# Patient Record
Sex: Male | Born: 1965 | Race: White | Hispanic: No | Marital: Married | Smoking: Never smoker
Health system: Southern US, Community
[De-identification: ages and names within clinical notes are randomized; demographics above are authoritative.]

## PROBLEM LIST (undated history)

## (undated) DIAGNOSIS — R112 Nausea with vomiting, unspecified: Secondary | ICD-10-CM

## (undated) DIAGNOSIS — M5136 Other intervertebral disc degeneration, lumbar region: Secondary | ICD-10-CM

## (undated) DIAGNOSIS — M1990 Unspecified osteoarthritis, unspecified site: Secondary | ICD-10-CM

## (undated) DIAGNOSIS — R7303 Prediabetes: Secondary | ICD-10-CM

## (undated) DIAGNOSIS — K219 Gastro-esophageal reflux disease without esophagitis: Secondary | ICD-10-CM

## (undated) DIAGNOSIS — F909 Attention-deficit hyperactivity disorder, unspecified type: Secondary | ICD-10-CM

## (undated) DIAGNOSIS — I10 Essential (primary) hypertension: Secondary | ICD-10-CM

## (undated) DIAGNOSIS — G8929 Other chronic pain: Secondary | ICD-10-CM

## (undated) DIAGNOSIS — Z9889 Other specified postprocedural states: Secondary | ICD-10-CM

## (undated) DIAGNOSIS — T8859XA Other complications of anesthesia, initial encounter: Secondary | ICD-10-CM

## (undated) DIAGNOSIS — B001 Herpesviral vesicular dermatitis: Secondary | ICD-10-CM

## (undated) DIAGNOSIS — R972 Elevated prostate specific antigen [PSA]: Secondary | ICD-10-CM

## (undated) HISTORY — PX: HERNIA REPAIR: SHX51

## (undated) HISTORY — PX: VASECTOMY: SHX75

## (undated) HISTORY — PX: CERVICAL SPINE SURGERY: SHX589

## (undated) HISTORY — PX: KNEE ARTHROSCOPY: SUR90

## (undated) HISTORY — PX: COLONOSCOPY: SHX174

---

## 2012-05-09 DIAGNOSIS — B001 Herpesviral vesicular dermatitis: Secondary | ICD-10-CM | POA: Insufficient documentation

## 2012-05-09 DIAGNOSIS — M549 Dorsalgia, unspecified: Secondary | ICD-10-CM | POA: Insufficient documentation

## 2014-07-29 DIAGNOSIS — K4090 Unilateral inguinal hernia, without obstruction or gangrene, not specified as recurrent: Secondary | ICD-10-CM | POA: Insufficient documentation

## 2016-04-25 DIAGNOSIS — R972 Elevated prostate specific antigen [PSA]: Secondary | ICD-10-CM | POA: Insufficient documentation

## 2017-02-11 DIAGNOSIS — M5441 Lumbago with sciatica, right side: Secondary | ICD-10-CM | POA: Insufficient documentation

## 2017-02-11 DIAGNOSIS — G8929 Other chronic pain: Secondary | ICD-10-CM | POA: Insufficient documentation

## 2017-04-08 DIAGNOSIS — R0789 Other chest pain: Secondary | ICD-10-CM | POA: Insufficient documentation

## 2017-04-08 DIAGNOSIS — I209 Angina pectoris, unspecified: Secondary | ICD-10-CM

## 2017-04-08 HISTORY — DX: Angina pectoris, unspecified: I20.9

## 2017-05-17 DIAGNOSIS — I10 Essential (primary) hypertension: Secondary | ICD-10-CM | POA: Insufficient documentation

## 2019-08-28 ENCOUNTER — Ambulatory Visit: Payer: Self-pay | Attending: Internal Medicine

## 2019-08-28 DIAGNOSIS — Z23 Encounter for immunization: Secondary | ICD-10-CM

## 2019-08-28 NOTE — Progress Notes (Signed)
   Covid-19 Vaccination Clinic  Name:  Willie Patton    MRN: [id number] DOB: 03-29-1966  08/28/2019  Willie Patton was observed post Covid-19 immunization for 15 minutes without incident. He was provided with Vaccine Information Sheet and instruction to access the V-Safe system.   Willie Patton was instructed to call 911 with any severe reactions post vaccine: Marland Kitchen Difficulty breathing  . Swelling of face and throat  . A fast heartbeat  . A bad rash all over body  . Dizziness and weakness   Immunizations Administered    Name Date Dose VIS Date Route   Pfizer COVID-19 Vaccine 08/28/2019  1:53 PM 0.3 mL 05/15/2019 Intramuscular   Manufacturer: [employer], [employer]   Lot: [id number]   NDC: [address]-2

## 2019-09-22 ENCOUNTER — Ambulatory Visit: Payer: Self-pay | Attending: Internal Medicine

## 2019-09-22 DIAGNOSIS — Z23 Encounter for immunization: Secondary | ICD-10-CM

## 2019-09-22 NOTE — Progress Notes (Signed)
   Covid-19 Vaccination Clinic  Name:  JONATHA GAGEN    MRN: [id number] DOB: 02/08/1966  09/22/2019  Mr. Tassinari was observed post Covid-19 immunization for 15 minutes without incident. He was provided with Vaccine Information Sheet and instruction to access the V-Safe system.   Mr. Drost was instructed to call 911 with any severe reactions post vaccine: Marland Kitchen Difficulty breathing  . Swelling of face and throat  . A fast heartbeat  . A bad rash all over body  . Dizziness and weakness   Immunizations Administered    Name Date Dose VIS Date Route   Pfizer COVID-19 Vaccine 09/22/2019  9:18 AM 0.3 mL 07/29/2018 Intramuscular   Manufacturer: [employer], [employer]   Lot: [id number]   NDC: [address]-9

## 2020-05-08 DIAGNOSIS — N529 Male erectile dysfunction, unspecified: Secondary | ICD-10-CM | POA: Insufficient documentation

## 2020-11-30 DIAGNOSIS — M5136 Other intervertebral disc degeneration, lumbar region: Secondary | ICD-10-CM | POA: Insufficient documentation

## 2020-12-28 ENCOUNTER — Other Ambulatory Visit: Payer: Self-pay | Admitting: Orthopedic Surgery

## 2020-12-28 DIAGNOSIS — G8929 Other chronic pain: Secondary | ICD-10-CM

## 2020-12-28 DIAGNOSIS — M5136 Other intervertebral disc degeneration, lumbar region: Secondary | ICD-10-CM

## 2020-12-28 DIAGNOSIS — M4807 Spinal stenosis, lumbosacral region: Secondary | ICD-10-CM

## 2020-12-28 DIAGNOSIS — M48061 Spinal stenosis, lumbar region without neurogenic claudication: Secondary | ICD-10-CM

## 2021-01-10 ENCOUNTER — Ambulatory Visit
Admission: RE | Admit: 2021-01-10 | Discharge: 2021-01-10 | Disposition: A | Discharge status: Home / Self Care | Payer: BC Managed Care – PPO | Source: Ambulatory Visit | Attending: Orthopedic Surgery | Admitting: Orthopedic Surgery

## 2021-01-10 ENCOUNTER — Other Ambulatory Visit: Payer: Self-pay

## 2021-01-10 DIAGNOSIS — M5136 Other intervertebral disc degeneration, lumbar region: Secondary | ICD-10-CM | POA: Insufficient documentation

## 2021-01-10 DIAGNOSIS — G8929 Other chronic pain: Secondary | ICD-10-CM | POA: Diagnosis present

## 2021-01-10 DIAGNOSIS — M48061 Spinal stenosis, lumbar region without neurogenic claudication: Secondary | ICD-10-CM | POA: Diagnosis present

## 2021-01-10 DIAGNOSIS — M4807 Spinal stenosis, lumbosacral region: Secondary | ICD-10-CM | POA: Insufficient documentation

## 2021-01-10 DIAGNOSIS — M5442 Lumbago with sciatica, left side: Secondary | ICD-10-CM | POA: Insufficient documentation

## 2021-03-19 DIAGNOSIS — M1712 Unilateral primary osteoarthritis, left knee: Secondary | ICD-10-CM | POA: Insufficient documentation

## 2021-05-23 NOTE — Discharge Instructions (Signed)
Instructions after Total Knee Replacement   Kaenan Jake P. Henry Demeritt, Jr., M.D.     Dept. of Orthopaedics & Sports Medicine  [facility]  [address]  [place], [place]  [address]  Phone: [phone]   Fax: [phone]    DIET: Drink plenty of non-alcoholic fluids. Resume your normal diet. Include foods high in fiber.  ACTIVITY:  You may use crutches or a walker with weight-bearing as tolerated, unless instructed otherwise. You may be weaned off of the walker or crutches by your Physical Therapist.  Do NOT place pillows under the knee. Anything placed under the knee could limit your ability to straighten the knee.   Continue doing gentle exercises. Exercising will reduce the pain and swelling, increase motion, and prevent muscle weakness.   Please continue to use the TED compression stockings for 6 weeks. You may remove the stockings at night, but should reapply them in the morning. Do not drive or operate any equipment until instructed.  WOUND CARE:  Continue to use the PolarCare or ice packs periodically to reduce pain and swelling. You may bathe or shower after the staples are removed at the first office visit following surgery.  MEDICATIONS: You may resume your regular medications. Please take the pain medication as prescribed on the medication. Do not take pain medication on an empty stomach. You have been given a prescription for a blood thinner (Lovenox or Coumadin). Please take the medication as instructed. (NOTE: After completing a 2 week course of Lovenox, take one Enteric-coated aspirin once a day. This along with elevation will help reduce the possibility of phlebitis in your operated leg.) Do not drive or drink alcoholic beverages when taking pain medications.  CALL THE OFFICE FOR: Temperature above 101 degrees Excessive bleeding or drainage on the dressing. Excessive swelling, coldness, or paleness of the toes. Persistent nausea and vomiting.  FOLLOW-UP:  You  should have an appointment to return to the office in 10-14 days after surgery. Arrangements have been made for continuation of Physical Therapy (either home therapy or outpatient therapy).   [facility] Department Directory         [email or web address]       [email or web address]          Cardiology  Appointments: [place] - [phone] [place] - [phone]  Endocrinology  Appointments: [place] - [phone] [place] - [phone]  Gastroenterology  Appointments: [place] - [phone] [place] - [phone]        General Surgery   Appointments: [place] - [phone]  Internal Medicine/Family Medicine  Appointments: [place] - [phone] [place] - [phone] [place] - [phone]  Metabolic and Weigh Loss Surgery  Appointments: [place] - [phone]        Neurology  Appointments: [place] - [phone] [place] - [phone]  Neurosurgery  Appointments: [place] - [phone]  Obstetrics & Gynecology  Appointments: [place] - [phone] [place] - [phone]        Pediatrics  Appointments: [place] - [phone] [place] - [phone]  Physiatry  Appointments: [place] -[phone]  Physical Therapy  Appointments: [place] - [phone] [place] - [phone]        Podiatry  Appointments: [place] - [phone] [place] - [phone]  Pulmonology  Appointments: [place] - [phone]  Rheumatology  Appointments: [place] - [phone]        [place] Location: [facility]  [address] [place], [place]  [address]  [place] Location: [facility] [address] [place], [place]  [address]  [place] Location: [facility] [address] [place], [place]  [address]    

## 2021-05-24 ENCOUNTER — Inpatient Hospital Stay: Admission: RE | Admit: 2021-05-24 | Payer: BC Managed Care – PPO | Source: Ambulatory Visit

## 2021-05-24 ENCOUNTER — Other Ambulatory Visit: Payer: BC Managed Care – PPO

## 2021-05-26 ENCOUNTER — Other Ambulatory Visit: Payer: Self-pay

## 2021-05-26 ENCOUNTER — Encounter
Admission: RE | Admit: 2021-05-26 | Discharge: 2021-05-26 | Disposition: A | Discharge status: Home / Self Care | Payer: BC Managed Care – PPO | Source: Ambulatory Visit | Attending: Orthopedic Surgery | Admitting: Orthopedic Surgery

## 2021-05-26 VITALS — BP 161/88 | HR 63 | Temp 98.6°F | Resp 16 | Ht 70.0 in | Wt 198.3 lb

## 2021-05-26 DIAGNOSIS — M1712 Unilateral primary osteoarthritis, left knee: Secondary | ICD-10-CM | POA: Diagnosis not present

## 2021-05-26 DIAGNOSIS — Z01810 Encounter for preprocedural cardiovascular examination: Secondary | ICD-10-CM | POA: Diagnosis not present

## 2021-05-26 DIAGNOSIS — Z01818 Encounter for other preprocedural examination: Secondary | ICD-10-CM | POA: Diagnosis not present

## 2021-05-26 DIAGNOSIS — Z880 Allergy status to penicillin: Secondary | ICD-10-CM | POA: Insufficient documentation

## 2021-05-26 HISTORY — DX: Unspecified osteoarthritis, unspecified site: M19.90

## 2021-05-26 HISTORY — DX: Attention-deficit hyperactivity disorder, unspecified type: F90.9

## 2021-05-26 HISTORY — DX: Other chronic pain: G89.29

## 2021-05-26 HISTORY — DX: Other specified postprocedural states: Z98.890

## 2021-05-26 HISTORY — DX: Herpesviral vesicular dermatitis: B00.1

## 2021-05-26 HISTORY — DX: Essential (primary) hypertension: I10

## 2021-05-26 HISTORY — DX: Gastro-esophageal reflux disease without esophagitis: K21.9

## 2021-05-26 HISTORY — DX: Other specified postprocedural states: R11.2

## 2021-05-26 LAB — CBC
HCT: 46.6 % (ref 39.0–52.0)
Hemoglobin: 15.5 g/dL (ref 13.0–17.0)
MCH: 29.8 pg (ref 26.0–34.0)
MCHC: 33.3 g/dL (ref 30.0–36.0)
MCV: 89.4 fL (ref 80.0–100.0)
Platelets: 313 10*3/uL (ref 150–400)
RBC: 5.21 MIL/uL (ref 4.22–5.81)
RDW: 13.8 % (ref 11.5–15.5)
WBC: 4.2 10*3/uL (ref 4.0–10.5)
nRBC: 0 % (ref 0.0–0.2)

## 2021-05-26 LAB — COMPREHENSIVE METABOLIC PANEL
ALT: 30 U/L (ref 0–44)
AST: 26 U/L (ref 15–41)
Albumin: 4.5 g/dL (ref 3.5–5.0)
Alkaline Phosphatase: 43 U/L (ref 38–126)
Anion gap: 5 (ref 5–15)
BUN: 18 mg/dL (ref 6–20)
CO2: 29 mmol/L (ref 22–32)
Calcium: 9.2 mg/dL (ref 8.9–10.3)
Chloride: 102 mmol/L (ref 98–111)
Creatinine, Ser: 0.71 mg/dL (ref 0.61–1.24)
GFR, Estimated: >60 mL/min (ref >60)
Glucose, Bld: 111 mg/dL — ABNORMAL HIGH (ref 70–99)
Potassium: 3.9 mmol/L (ref 3.5–5.1)
Sodium: 136 mmol/L (ref 135–145)
Total Bilirubin: 0.8 mg/dL (ref 0.3–1.2)
Total Protein: 7.7 g/dL (ref 6.5–8.1)

## 2021-05-26 LAB — URINALYSIS, ROUTINE W REFLEX MICROSCOPIC
Bacteria, UA: NONE SEEN
Bilirubin Urine: NEGATIVE
Glucose, UA: NEGATIVE mg/dL
Hgb urine dipstick: NEGATIVE
Ketones, ur: NEGATIVE mg/dL
Leukocytes,Ua: NEGATIVE
Nitrite: NEGATIVE
Protein, ur: NEGATIVE mg/dL
Specific Gravity, Urine: 1.02 (ref 1.005–1.030)
pH: 7 (ref 5.0–8.0)

## 2021-05-26 LAB — SURGICAL PCR SCREEN
MRSA, PCR: NEGATIVE
Staphylococcus aureus: POSITIVE — AB

## 2021-05-26 LAB — APTT: aPTT: 30 seconds (ref 24–36)

## 2021-05-26 LAB — PROTIME-INR
INR: 0.9 (ref 0.8–1.2)
Prothrombin Time: 12.4 seconds (ref 11.4–15.2)

## 2021-05-26 LAB — SEDIMENTATION RATE: Sed Rate: >140 mm/hr — ABNORMAL HIGH (ref 0–20)

## 2021-05-26 LAB — TYPE AND SCREEN
ABO/RH(D): O POS
Antibody Screen: NEGATIVE

## 2021-05-26 LAB — C-REACTIVE PROTEIN: CRP: 0.5 mg/dL (ref <1.0)

## 2021-05-26 NOTE — Patient Instructions (Addendum)
Your procedure is scheduled on: Friday 1/6 Report to Day Surgery. Stop by registration desk in medical mall first To find out your arrival time please call [phone] between 1PM - 3PM on Thurs 1/5.  Remember: Instructions that are not followed completely may result in serious medical risk,  up to and including death, or upon the discretion of your surgeon and anesthesiologist your  surgery may need to be rescheduled.     _X__ 1. Do not eat food after midnight the night before your procedure.                 No chewing gum or hard candies. You may drink clear liquids up to 2 hours                 before you are scheduled to arrive for your surgery- DO not drink clear                 liquids within 2 hours of the start of your surgery.                 Clear Liquids include:  water, apple juice without pulp, clear Gatorade, G2 or                  Gatorade Zero (avoid Red/Purple/Blue), Black Coffee or Tea (Do not add                 anything to coffee or tea). ___x__2.   Complete the "Ensure Clear Pre-surgery Clear Carbohydrate Drink"  provided to you, 2 hours before arrival. **If you are diabetic you will be       provided with an alternative drink, Gatorade Zero or G2.  __X__2.  On the morning of surgery brush your teeth with toothpaste and water, you                may rinse your mouth with mouthwash if you wish.  Do not swallow any toothpaste of mouthwash.     _X__ 3.  No Alcohol for 24 hours before or after surgery.   ___ 4.  Do Not Smoke or use e-cigarettes For 24 Hours Prior to Your Surgery.                 Do not use any chewable tobacco products for at least 6 hours prior to                 Surgery.  __  5.  Do not use any recreational drugs (marijuana, cocaine, heroin, ecstasy, MDMA or other) For at least one week prior to your surgery.            Combination of these drugs with anesthesia may have life threatening  results.  ____  6.  Bring all  medications with you on the day of surgery if instructed.   _x___  7.  Notify your doctor if there is any change in your medical condition      (cold, fever, infections).     Do not wear jewelry,  Do not wear lotions, powders, or perfumes. You may wear deodorant. Do not shave 48 hours prior to surgery. Men may shave face and neck. Do not bring valuables to the hospital.    [facility] is not responsible for any belongings or valuables.  Contacts, dentures or bridgework may not be worn into surgery. Leave your suitcase in the car. After surgery it may be brought to your room. For patients  admitted to the hospital, discharge time is determined by your treatment team.   Patients discharged the day of surgery will not be allowed to drive home.   Make arrangements for someone to be with you for the first 24 hours of your Same Day Discharge.    Please read over the following fact sheets that you were given:   Incentive spirometer    __x__ Take these medicines the morning of surgery with A SIP OF WATER:    1. Flonase if needed  2.   3.   4.  5.  6.  ____ Fleet Enema (as directed)   __x__ Use CHG Soap (or wipes) as directed  ____ Use Benzoyl Peroxide Gel as instructed  ____ Use inhalers on the day of surgery  ____ Stop metformin 2 days prior to surgery    ____ Take 1/2 of usual insulin dose the night before surgery. No insulin the morning          of surgery.   ____ Stop Coumadin/Plavix/aspirin on   __x__ Stop Anti-inflammatories on meloxicam (MOBIC) 15 MG tablet today   __x__ Stop supplements until after surgery.  Multiple Vitamin (MULTIVITAMIN WITH MINERALS) TABS tablet today  ____ Bring C-Pap to the hospital.    If you have any questions regarding your pre-procedure instructions,  Please call Pre-admit Testing at [phone]

## 2021-05-31 LAB — IGE: IgE (Immunoglobulin E), Serum: 47 IU/mL (ref 6–495)

## 2021-06-03 ENCOUNTER — Encounter: Payer: Self-pay | Admitting: Orthopedic Surgery

## 2021-06-07 ENCOUNTER — Other Ambulatory Visit: Payer: Self-pay

## 2021-06-07 ENCOUNTER — Other Ambulatory Visit
Admission: RE | Admit: 2021-06-07 | Discharge: 2021-06-07 | Disposition: A | Discharge status: Home / Self Care | Payer: BC Managed Care – PPO | Source: Ambulatory Visit | Attending: Orthopedic Surgery | Admitting: Orthopedic Surgery

## 2021-06-07 DIAGNOSIS — Z20822 Contact with and (suspected) exposure to covid-19: Secondary | ICD-10-CM | POA: Insufficient documentation

## 2021-06-07 DIAGNOSIS — Z01812 Encounter for preprocedural laboratory examination: Secondary | ICD-10-CM | POA: Diagnosis not present

## 2021-06-07 LAB — SARS CORONAVIRUS 2 (TAT 6-24 HRS): SARS Coronavirus 2: NEGATIVE

## 2021-06-08 NOTE — H&P (Signed)
ORTHOPAEDIC HISTORY & PHYSICAL Regino Bellow, PA - 05/31/2021 3:45 PM EST Formatting of this note is different from the original. Images from the original note were not included. Chief Complaint Chief Complaint  Patient presents with   Left Knee - Pain  History & Physical Left TKA 06/09/21 [facility]   Reason for Visit Willie Patton is a 56 y.o. who presents today presents today for history and physical. He is to undergo a left total knee arthroplasty on 06/09/2021. Last seen in clinic on 03/14/2021. There is been no change in his condition since that time. Patient states the pain has increased to the point that is interfering with his quality of life and daily activities. He wishes to proceed with surgery.  He reports a long history of left knee pain. He localizes most of the pain along the medial aspect of the knee. He reports some swelling, no locking, and some near giving way of the knee. The pain is aggravated by any weight bearing. The knee pain limits the patient's ability to ambulate long distances. The patient has not appreciated any significant improvement despite NSAIDs, intra-articular corticosteroid injection, and activity modification. He is not using any ambulatory aids.     Past Medical History Past Medical History:  Diagnosis Date   ADHD (attention deficit hyperactivity disorder)   Allergic state 2003   Arthritis 2012   Atypical chest pain 04/08/2017   Chronic back pain   Essential hypertension 05/17/2017   Herpes labialis, uses prn Valtrex 05/09/2012   PONV (postoperative nausea and vomiting)   Past Surgical History Past Surgical History:  Procedure Laterality Date   ceervical spine x2 2000 and ?2003  Disc diseas   HERNIA REPAIR  umbilical   KNEE ARTHROSCOPY   VASECTOMY   Past Family History Family History  Problem Relation Age of Onset   Lung disease Mother  Pulmonary fibrosis?   Osteoarthritis Mother   Pancreatic cancer Father 65   Diabetes type II Father    No Known Problems Sister   No Known Problems Sister   No Known Problems Sister   No Known Problems Sister   Skin cancer Brother   High blood pressure (Hypertension) Brother   Diabetes type II Brother   High blood pressure (Hypertension) Brother   Skin cancer Brother   No Known Problems Brother   No Known Problems Brother   Crohn's disease Son   Anxiety Son  Lota   Anesthesia problems Neg Hx   Malignant hypertension Neg Hx   Colon cancer Neg Hx   Medications Current Outpatient Medications Ordered in Epic  Medication Sig Dispense Refill   fluticasone (FLONASE) 50 mcg/actuation nasal spray Place 2 sprays into both nostrils once daily.   gabapentin (NEURONTIN) 300 MG capsule Take 1 capsule (300 mg total) by mouth at bedtime for 360 days 90 capsule 3   loratadine (CLARITIN) 10 mg tablet Take 10 mg by mouth once daily   losartan (COZAAR) 100 MG tablet Take 1 tablet (100 mg total) by mouth once daily for 360 days 90 tablet 3   meloxicam (MOBIC) 15 MG tablet Take 1 tablet (15 mg total) by mouth once daily 30 tablet 11   omeprazole (PRILOSEC) 20 MG DR capsule Take 20 mg by mouth once daily   sildenafiL (VIAGRA) 100 MG tablet Take 0.5-1 tablets (50-100 mg total) by mouth once daily as needed for Erectile Dysfunction for up to 360 days 10 tablet 11   No current Epic-ordered facility-administered medications on file.  Allergies Allergies  Allergen Reactions   Penicillins Hives    Review of Systems A comprehensive 14 point ROS was performed, reviewed, and the pertinent orthopaedic findings are documented in the HPI.  Exam BP 132/84 (BP Location: Left upper arm, Patient Position: Sitting, BP Cuff Size: Adult)   Ht 180.1 cm (5' 10.9")   Wt 91 kg (200 lb 9.6 oz)   BMI 28.06 kg/m   General: Well-developed well-nourished male seen in no acute distress.   HEENT: Atraumatic,normocephalic. Pupils are equal and reactive to light. Oropharynx is clear with moist mucosa  Lungs: Clear  to auscultation bilaterally   Cardiovascular: Regular rate and rhythm. Normal S1, S2. No murmurs. No appreciable gallops or rubs. Peripheral pulses are palpable.  Abdomen: Soft, non-tender, nondistended. Bowel sounds present  Extremity: Left Knee: Soft tissue swelling: minimal Effusion: none Erythema: none Crepitance: mild Tenderness: medial Alignment: relative varus Mediolateral laxity: medial pseudolaxity Posterior sag: negative Patellar tracking: Good tracking without evidence of subluxation or tilt Atrophy: No significant atrophy.  Quadriceps tone was fair to good. Range of motion: 0/0/120 degrees    Neurological:  The patient is alert and oriented Sensation to light touch appears to be intact and within normal limits Gross motor strength appeared to be equal to 5/5  Vascular :  Peripheral pulses felt to be palpable. Capillary refill appears to be intact and within normal limits  X-ray  1. AP standing, lateral and sunrise view of the left knee ordered and interpreted on today's visit shows narrowing of the medial cartilage space with bone-on-bone articulation and associated varus alignment. Osteophyte formation was noted as well as subchondral porosis. There is no fractures or dislocations.  Impression  1. 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 Patient currently taking no anticoagulation medication 2. Did discuss postop rehab course 3. Return to clinic 2 weeks postop. Sooner if any problems  This note was generated in part with voice recognition software and I apologize for any typographical errors that were not detected and corrected   Watt Climes PA Electronically signed by Regino Bellow, PA at 06/01/2021 9:19 AM EST

## 2021-06-09 ENCOUNTER — Observation Stay: Payer: BC Managed Care – PPO

## 2021-06-09 ENCOUNTER — Ambulatory Visit: Payer: BC Managed Care – PPO | Admitting: Certified Registered Nurse Anesthetist

## 2021-06-09 ENCOUNTER — Encounter: Payer: Self-pay | Admitting: Orthopedic Surgery

## 2021-06-09 ENCOUNTER — Ambulatory Visit: Payer: BC Managed Care – PPO | Admitting: Urgent Care

## 2021-06-09 ENCOUNTER — Encounter
Admission: RE | Disposition: A | Discharge status: Home Health | Payer: Self-pay | Source: Home / Self Care | Attending: Orthopedic Surgery

## 2021-06-09 ENCOUNTER — Observation Stay
Admission: RE | Admit: 2021-06-09 | Discharge: 2021-06-10 | Disposition: A | Discharge status: Home Health | Payer: BC Managed Care – PPO | Attending: Orthopedic Surgery | Admitting: Orthopedic Surgery

## 2021-06-09 ENCOUNTER — Other Ambulatory Visit: Payer: Self-pay

## 2021-06-09 DIAGNOSIS — Z880 Allergy status to penicillin: Secondary | ICD-10-CM

## 2021-06-09 DIAGNOSIS — F909 Attention-deficit hyperactivity disorder, unspecified type: Secondary | ICD-10-CM | POA: Insufficient documentation

## 2021-06-09 DIAGNOSIS — M1712 Unilateral primary osteoarthritis, left knee: Principal | ICD-10-CM | POA: Insufficient documentation

## 2021-06-09 DIAGNOSIS — I10 Essential (primary) hypertension: Secondary | ICD-10-CM | POA: Diagnosis not present

## 2021-06-09 DIAGNOSIS — Z96659 Presence of unspecified artificial knee joint: Secondary | ICD-10-CM

## 2021-06-09 DIAGNOSIS — Z96652 Presence of left artificial knee joint: Secondary | ICD-10-CM

## 2021-06-09 HISTORY — PX: KNEE ARTHROPLASTY: SHX992

## 2021-06-09 LAB — ABO/RH: ABO/RH(D): O POS

## 2021-06-09 SURGERY — ARTHROPLASTY, KNEE, TOTAL, USING IMAGELESS COMPUTER-ASSISTED NAVIGATION
Anesthesia: Regional | Site: Knee | Laterality: Left

## 2021-06-09 MED ORDER — PANTOPRAZOLE SODIUM 40 MG PO TBEC
40.0000 mg | DELAYED_RELEASE_TABLET | Freq: Two times a day (BID) | ORAL | Status: DC
  Administered 2021-06-09 – 2021-06-10 (×3): 40 mg via ORAL
  Filled 2021-06-09 (×4): qty 1

## 2021-06-09 MED ORDER — GABAPENTIN 300 MG PO CAPS
ORAL_CAPSULE | ORAL | Status: AC
  Administered 2021-06-09: 300 mg via ORAL
  Filled 2021-06-09: qty 1

## 2021-06-09 MED ORDER — ORAL CARE MOUTH RINSE: 15.0000 mL | Freq: Once | OROMUCOSAL | Status: AC

## 2021-06-09 MED ORDER — PROPOFOL 500 MG/50ML IV EMUL
INTRAVENOUS | Status: DC | PRN
  Administered 2021-06-09: 100 ug/kg/min via INTRAVENOUS

## 2021-06-09 MED ORDER — CEFAZOLIN SODIUM-DEXTROSE 2-4 GM/100ML-% IV SOLN
2.0000 g | Freq: Four times a day (QID) | INTRAVENOUS | Status: AC
  Administered 2021-06-09: 2 g via INTRAVENOUS
  Filled 2021-06-09 (×3): qty 100

## 2021-06-09 MED ORDER — BUPIVACAINE HCL (PF) 0.5 % IJ SOLN
INTRAMUSCULAR | Status: AC
  Filled 2021-06-09: qty 10

## 2021-06-09 MED ORDER — ACETAMINOPHEN 10 MG/ML IV SOLN
INTRAVENOUS | Status: AC
  Filled 2021-06-09: qty 100

## 2021-06-09 MED ORDER — PHENYLEPHRINE HCL-NACL 20-0.9 MG/250ML-% IV SOLN
INTRAVENOUS | Status: DC | PRN
  Administered 2021-06-09: 50 ug/min via INTRAVENOUS

## 2021-06-09 MED ORDER — MIDAZOLAM HCL 2 MG/2ML IJ SOLN
INTRAMUSCULAR | Status: AC
  Filled 2021-06-09: qty 2

## 2021-06-09 MED ORDER — FLEET ENEMA 7-19 GM/118ML RE ENEM: 1.0000 | ENEMA | Freq: Once | RECTAL | Status: DC | PRN

## 2021-06-09 MED ORDER — HYDROMORPHONE HCL 1 MG/ML IJ SOLN: 0.5000 mg | INTRAMUSCULAR | Status: DC | PRN

## 2021-06-09 MED ORDER — FENTANYL CITRATE (PF) 100 MCG/2ML IJ SOLN: 25.0000 ug | INTRAMUSCULAR | Status: DC | PRN

## 2021-06-09 MED ORDER — DEXMEDETOMIDINE HCL IN NACL 200 MCG/50ML IV SOLN
INTRAVENOUS | Status: AC
  Filled 2021-06-09: qty 50

## 2021-06-09 MED ORDER — CELECOXIB 200 MG PO CAPS: 400.0000 mg | ORAL_CAPSULE | Freq: Once | ORAL | Status: AC

## 2021-06-09 MED ORDER — LOSARTAN POTASSIUM 50 MG PO TABS
100.0000 mg | ORAL_TABLET | Freq: Every day | ORAL | Status: DC
  Administered 2021-06-09 – 2021-06-10 (×2): 100 mg via ORAL
  Filled 2021-06-09 (×2): qty 2

## 2021-06-09 MED ORDER — DEXMEDETOMIDINE (PRECEDEX) IN NS 20 MCG/5ML (4 MCG/ML) IV SYRINGE
PREFILLED_SYRINGE | INTRAVENOUS | Status: DC | PRN
  Administered 2021-06-09: 4 ug via INTRAVENOUS
  Administered 2021-06-09 (×3): 8 ug via INTRAVENOUS

## 2021-06-09 MED ORDER — ONDANSETRON HCL 4 MG PO TABS
4.0000 mg | ORAL_TABLET | Freq: Four times a day (QID) | ORAL | Status: DC | PRN
  Filled 2021-06-09: qty 1

## 2021-06-09 MED ORDER — FENTANYL CITRATE (PF) 100 MCG/2ML IJ SOLN
INTRAMUSCULAR | Status: AC
  Filled 2021-06-09: qty 2

## 2021-06-09 MED ORDER — CEFAZOLIN SODIUM-DEXTROSE 2-4 GM/100ML-% IV SOLN
INTRAVENOUS | Status: AC
  Administered 2021-06-09: 2 g via INTRAVENOUS
  Filled 2021-06-09: qty 100

## 2021-06-09 MED ORDER — ONDANSETRON HCL 4 MG/2ML IJ SOLN
INTRAMUSCULAR | Status: AC
  Filled 2021-06-09: qty 2

## 2021-06-09 MED ORDER — TRANEXAMIC ACID-NACL 1000-0.7 MG/100ML-% IV SOLN
1000.0000 mg | Freq: Once | INTRAVENOUS | Status: AC
  Administered 2021-06-09: 1000 mg via INTRAVENOUS

## 2021-06-09 MED ORDER — MIDAZOLAM HCL 5 MG/5ML IJ SOLN
INTRAMUSCULAR | Status: DC | PRN
  Administered 2021-06-09: 2 mg via INTRAVENOUS

## 2021-06-09 MED ORDER — SODIUM CHLORIDE 0.9 % IR SOLN
Status: DC | PRN
Start: 2021-06-09 — End: 2021-06-09
  Administered 2021-06-09: 3000 mL

## 2021-06-09 MED ORDER — MAGNESIUM HYDROXIDE 400 MG/5ML PO SUSP
30.0000 mL | Freq: Every day | ORAL | Status: DC
  Administered 2021-06-09 – 2021-06-10 (×2): 30 mL via ORAL
  Filled 2021-06-09 (×2): qty 30

## 2021-06-09 MED ORDER — LORATADINE 10 MG PO TABS
10.0000 mg | ORAL_TABLET | Freq: Every day | ORAL | Status: DC
  Administered 2021-06-09 – 2021-06-10 (×2): 10 mg via ORAL
  Filled 2021-06-09 (×2): qty 1

## 2021-06-09 MED ORDER — FAMOTIDINE 20 MG PO TABS
ORAL_TABLET | ORAL | Status: AC
  Administered 2021-06-09: 20 mg via ORAL
  Filled 2021-06-09: qty 1

## 2021-06-09 MED ORDER — FERROUS SULFATE 325 (65 FE) MG PO TABS
325.0000 mg | ORAL_TABLET | Freq: Two times a day (BID) | ORAL | Status: DC
  Administered 2021-06-09 – 2021-06-10 (×2): 325 mg via ORAL
  Filled 2021-06-09 (×2): qty 1

## 2021-06-09 MED ORDER — CHLORHEXIDINE GLUCONATE 0.12 % MT SOLN: 15.0000 mL | Freq: Once | OROMUCOSAL | Status: AC

## 2021-06-09 MED ORDER — APREPITANT 40 MG PO CAPS
ORAL_CAPSULE | ORAL | Status: AC
  Administered 2021-06-09: 40 mg via ORAL
  Filled 2021-06-09: qty 1

## 2021-06-09 MED ORDER — APREPITANT 40 MG PO CAPS: 40.0000 mg | ORAL_CAPSULE | Freq: Once | ORAL | Status: AC

## 2021-06-09 MED ORDER — METOCLOPRAMIDE HCL 10 MG PO TABS
10.0000 mg | ORAL_TABLET | Freq: Three times a day (TID) | ORAL | Status: DC
  Administered 2021-06-09 – 2021-06-10 (×3): 10 mg via ORAL
  Filled 2021-06-09 (×3): qty 1

## 2021-06-09 MED ORDER — PROPOFOL 1000 MG/100ML IV EMUL
INTRAVENOUS | Status: AC
  Filled 2021-06-09: qty 100

## 2021-06-09 MED ORDER — BUPIVACAINE HCL (PF) 0.25 % IJ SOLN
INTRAMUSCULAR | Status: AC
  Filled 2021-06-09: qty 120

## 2021-06-09 MED ORDER — BUPIVACAINE LIPOSOME 1.3 % IJ SUSP
INTRAMUSCULAR | Status: AC
  Filled 2021-06-09: qty 40

## 2021-06-09 MED ORDER — PHENYLEPHRINE HCL-NACL 20-0.9 MG/250ML-% IV SOLN
INTRAVENOUS | Status: AC
  Filled 2021-06-09: qty 250

## 2021-06-09 MED ORDER — DEXAMETHASONE SODIUM PHOSPHATE 10 MG/ML IJ SOLN
INTRAMUSCULAR | Status: AC
  Administered 2021-06-09: 8 mg via INTRAVENOUS
  Filled 2021-06-09: qty 1

## 2021-06-09 MED ORDER — CELECOXIB 200 MG PO CAPS
ORAL_CAPSULE | ORAL | Status: AC
  Administered 2021-06-09: 400 mg via ORAL
  Filled 2021-06-09: qty 2

## 2021-06-09 MED ORDER — SODIUM CHLORIDE (PF) 0.9 % IJ SOLN
INTRAMUSCULAR | Status: DC | PRN
  Administered 2021-06-09: 120 mL via INTRAMUSCULAR

## 2021-06-09 MED ORDER — DEXAMETHASONE SODIUM PHOSPHATE 10 MG/ML IJ SOLN: 8.0000 mg | Freq: Once | INTRAMUSCULAR | Status: AC

## 2021-06-09 MED ORDER — ADULT MULTIVITAMIN W/MINERALS CH
1.0000 | ORAL_TABLET | Freq: Every day | ORAL | Status: DC
  Administered 2021-06-09 – 2021-06-10 (×2): 1 via ORAL
  Filled 2021-06-09 (×2): qty 1

## 2021-06-09 MED ORDER — TRANEXAMIC ACID-NACL 1000-0.7 MG/100ML-% IV SOLN: 1000.0000 mg | INTRAVENOUS | Status: DC

## 2021-06-09 MED ORDER — OXYCODONE HCL 5 MG PO TABS: 5.0000 mg | ORAL_TABLET | ORAL | Status: DC | PRN

## 2021-06-09 MED ORDER — TRAMADOL HCL 50 MG PO TABS: 50.0000 mg | ORAL_TABLET | ORAL | Status: DC | PRN

## 2021-06-09 MED ORDER — CEFAZOLIN SODIUM-DEXTROSE 2-4 GM/100ML-% IV SOLN
2.0000 g | INTRAVENOUS | Status: AC
  Administered 2021-06-09: 2 g via INTRAVENOUS

## 2021-06-09 MED ORDER — ACETAMINOPHEN 325 MG PO TABS: 325.0000 mg | ORAL_TABLET | Freq: Four times a day (QID) | ORAL | Status: DC | PRN

## 2021-06-09 MED ORDER — ENOXAPARIN SODIUM 30 MG/0.3ML IJ SOSY
30.0000 mg | PREFILLED_SYRINGE | Freq: Two times a day (BID) | INTRAMUSCULAR | Status: DC
  Administered 2021-06-10: 30 mg via SUBCUTANEOUS
  Filled 2021-06-09: qty 0.3

## 2021-06-09 MED ORDER — BISACODYL 10 MG RE SUPP
10.0000 mg | Freq: Every day | RECTAL | Status: DC | PRN
  Filled 2021-06-09: qty 1

## 2021-06-09 MED ORDER — FLUTICASONE PROPIONATE 50 MCG/ACT NA SUSP
2.0000 | Freq: Every day | NASAL | Status: DC
  Filled 2021-06-09: qty 16

## 2021-06-09 MED ORDER — SODIUM CHLORIDE FLUSH 0.9 % IV SOLN
INTRAVENOUS | Status: AC
  Filled 2021-06-09: qty 80

## 2021-06-09 MED ORDER — ACETAMINOPHEN 10 MG/ML IV SOLN
1000.0000 mg | Freq: Four times a day (QID) | INTRAVENOUS | Status: AC
  Administered 2021-06-09 – 2021-06-10 (×4): 1000 mg via INTRAVENOUS
  Filled 2021-06-09 (×4): qty 100

## 2021-06-09 MED ORDER — ENSURE PRE-SURGERY PO LIQD
296.0000 mL | Freq: Once | ORAL | Status: DC
  Filled 2021-06-09: qty 296

## 2021-06-09 MED ORDER — TRANEXAMIC ACID-NACL 1000-0.7 MG/100ML-% IV SOLN
INTRAVENOUS | Status: AC
  Filled 2021-06-09: qty 100

## 2021-06-09 MED ORDER — GLYCOPYRROLATE 0.2 MG/ML IJ SOLN
INTRAMUSCULAR | Status: AC
  Filled 2021-06-09: qty 1

## 2021-06-09 MED ORDER — CEFAZOLIN SODIUM-DEXTROSE 2-4 GM/100ML-% IV SOLN
INTRAVENOUS | Status: AC
  Administered 2021-06-09: 2000 mg
  Filled 2021-06-09: qty 100

## 2021-06-09 MED ORDER — ONDANSETRON HCL 4 MG/2ML IJ SOLN
INTRAMUSCULAR | Status: DC | PRN
  Administered 2021-06-09: 4 mg via INTRAVENOUS

## 2021-06-09 MED ORDER — OXYCODONE HCL 5 MG PO TABS
10.0000 mg | ORAL_TABLET | ORAL | Status: DC | PRN
  Administered 2021-06-09: 10 mg via ORAL
  Filled 2021-06-09: qty 2

## 2021-06-09 MED ORDER — FAMOTIDINE 20 MG PO TABS: 20.0000 mg | ORAL_TABLET | Freq: Once | ORAL | Status: AC

## 2021-06-09 MED ORDER — 0.9 % SODIUM CHLORIDE (POUR BTL) OPTIME
TOPICAL | Status: DC | PRN
  Administered 2021-06-09: 500 mL

## 2021-06-09 MED ORDER — CELECOXIB 200 MG PO CAPS
200.0000 mg | ORAL_CAPSULE | Freq: Two times a day (BID) | ORAL | Status: DC
  Administered 2021-06-10: 200 mg via ORAL
  Filled 2021-06-09 (×2): qty 1

## 2021-06-09 MED ORDER — MENTHOL 3 MG MT LOZG
1.0000 | LOZENGE | OROMUCOSAL | Status: DC | PRN
  Filled 2021-06-09: qty 9

## 2021-06-09 MED ORDER — SENNOSIDES-DOCUSATE SODIUM 8.6-50 MG PO TABS
1.0000 | ORAL_TABLET | Freq: Two times a day (BID) | ORAL | Status: DC
  Administered 2021-06-09 – 2021-06-10 (×2): 1 via ORAL
  Filled 2021-06-09 (×2): qty 1

## 2021-06-09 MED ORDER — CHLORHEXIDINE GLUCONATE 0.12 % MT SOLN
OROMUCOSAL | Status: AC
  Administered 2021-06-09: 15 mL via OROMUCOSAL
  Filled 2021-06-09: qty 15

## 2021-06-09 MED ORDER — CHLORHEXIDINE GLUCONATE 4 % EX LIQD
60.0000 mL | Freq: Once | CUTANEOUS | Status: DC
Start: 2021-06-09 — End: 2021-06-09

## 2021-06-09 MED ORDER — GABAPENTIN 300 MG PO CAPS
300.0000 mg | ORAL_CAPSULE | Freq: Every day | ORAL | Status: DC
  Administered 2021-06-09: 300 mg via ORAL
  Filled 2021-06-09: qty 1

## 2021-06-09 MED ORDER — PHENYLEPHRINE HCL (PRESSORS) 10 MG/ML IV SOLN
INTRAVENOUS | Status: DC | PRN
  Administered 2021-06-09 (×4): 80 ug via INTRAVENOUS

## 2021-06-09 MED ORDER — TRANEXAMIC ACID-NACL 1000-0.7 MG/100ML-% IV SOLN
INTRAVENOUS | Status: DC | PRN
  Administered 2021-06-09: 1000 mg via INTRAVENOUS

## 2021-06-09 MED ORDER — LACTATED RINGERS IV SOLN: INTRAVENOUS | Status: DC

## 2021-06-09 MED ORDER — GABAPENTIN 300 MG PO CAPS: 300.0000 mg | ORAL_CAPSULE | Freq: Once | ORAL | Status: AC

## 2021-06-09 MED ORDER — PROPOFOL 10 MG/ML IV BOLUS
INTRAVENOUS | Status: DC | PRN
  Administered 2021-06-09: 20 mg via INTRAVENOUS
  Administered 2021-06-09: 30 mg via INTRAVENOUS
  Administered 2021-06-09 (×2): 20 mg via INTRAVENOUS

## 2021-06-09 MED ORDER — ACETAMINOPHEN 10 MG/ML IV SOLN
INTRAVENOUS | Status: DC | PRN
  Administered 2021-06-09: 1000 mg via INTRAVENOUS

## 2021-06-09 MED ORDER — DIPHENHYDRAMINE HCL 12.5 MG/5ML PO ELIX
12.5000 mg | ORAL_SOLUTION | ORAL | Status: DC | PRN
  Filled 2021-06-09: qty 10

## 2021-06-09 MED ORDER — GLYCOPYRROLATE 0.2 MG/ML IJ SOLN
INTRAMUSCULAR | Status: DC | PRN
  Administered 2021-06-09: .2 mg via INTRAVENOUS

## 2021-06-09 MED ORDER — BUPIVACAINE HCL (PF) 0.5 % IJ SOLN
INTRAMUSCULAR | Status: DC | PRN
  Administered 2021-06-09: 3 mL

## 2021-06-09 MED ORDER — ONDANSETRON HCL 4 MG/2ML IJ SOLN: 4.0000 mg | Freq: Once | INTRAMUSCULAR | Status: DC | PRN

## 2021-06-09 MED ORDER — PRONTOSAN WOUND IRRIGATION OPTIME
TOPICAL | Status: DC | PRN
  Administered 2021-06-09: 1

## 2021-06-09 MED ORDER — ONDANSETRON HCL 4 MG/2ML IJ SOLN: 4.0000 mg | Freq: Four times a day (QID) | INTRAMUSCULAR | Status: DC | PRN

## 2021-06-09 MED ORDER — SODIUM CHLORIDE 0.9 % IV SOLN: INTRAVENOUS | Status: DC

## 2021-06-09 MED ORDER — ALUM & MAG HYDROXIDE-SIMETH 200-200-20 MG/5ML PO SUSP: 30.0000 mL | ORAL | Status: DC | PRN

## 2021-06-09 MED ORDER — PHENOL 1.4 % MT LIQD
1.0000 | OROMUCOSAL | Status: DC | PRN
  Filled 2021-06-09: qty 177

## 2021-06-09 SURGICAL SUPPLY — 77 items
ATTUNE MED DOME PAT 41 KNEE (Knees) ×1 IMPLANT
ATTUNE PS FEM LT SZ 8 CEM KNEE (Femur) ×1 IMPLANT
ATTUNE PSRP INSR SZ8 7 KNEE (Insert) ×1 IMPLANT
BASE TIBIAL ROT PLAT SZ 8 KNEE (Knees) IMPLANT
BATTERY INSTRU NAVIGATION (MISCELLANEOUS) ×8 IMPLANT
BLADE CLIPPER SURG (BLADE) ×1 IMPLANT
BLADE SAW 70X12.5 (BLADE) ×2 IMPLANT
BLADE SAW 90X13X1.19 OSCILLAT (BLADE) ×2 IMPLANT
BLADE SAW 90X25X1.19 OSCILLAT (BLADE) ×2 IMPLANT
BONE CEMENT GENTAMICIN (Cement) ×4 IMPLANT
CEMENT BONE GENTAMICIN 40 (Cement) IMPLANT
COOLER POLAR GLACIER W/PUMP (MISCELLANEOUS) ×2 IMPLANT
CUFF TOURN SGL QUICK 24 (TOURNIQUET CUFF)
CUFF TOURN SGL QUICK 34 (TOURNIQUET CUFF)
CUFF TRNQT CYL 24X4X16.5-23 (TOURNIQUET CUFF) IMPLANT
CUFF TRNQT CYL 34X4.125X (TOURNIQUET CUFF) IMPLANT
DRAPE 3/4 80X56 (DRAPES) ×2 IMPLANT
DRAPE INCISE IOBAN 66X45 STRL (DRAPES) ×2 IMPLANT
DRSG DERMACEA 8X12 NADH (GAUZE/BANDAGES/DRESSINGS) ×2 IMPLANT
DRSG MEPILEX SACRM 8.7X9.8 (GAUZE/BANDAGES/DRESSINGS) ×2 IMPLANT
DRSG OPSITE POSTOP 4X14 (GAUZE/BANDAGES/DRESSINGS) ×2 IMPLANT
DRSG TEGADERM 4X4.75 (GAUZE/BANDAGES/DRESSINGS) ×3 IMPLANT
DURAPREP 26ML APPLICATOR (WOUND CARE) ×4 IMPLANT
ELECT CAUTERY BLADE 6.4 (BLADE) ×2 IMPLANT
ELECT REM PT RETURN 9FT ADLT (ELECTROSURGICAL) ×2
ELECTRODE REM PT RTRN 9FT ADLT (ELECTROSURGICAL) ×1 IMPLANT
EX-PIN ORTHOLOCK NAV 4X150 (PIN) ×4 IMPLANT
GLOVE SRG 8 PF TXTR STRL LF DI (GLOVE) ×1 IMPLANT
GLOVE SURG ENC TEXT LTX SZ7.5 (GLOVE) ×4 IMPLANT
GLOVE SURG UNDER POLY LF SZ7.5 (GLOVE) ×2 IMPLANT
GLOVE SURG UNDER POLY LF SZ8 (GLOVE) ×1
GOWN STRL REUS W/ TWL LRG LVL3 (GOWN DISPOSABLE) ×2 IMPLANT
GOWN STRL REUS W/ TWL XL LVL3 (GOWN DISPOSABLE) ×1 IMPLANT
GOWN STRL REUS W/TWL LRG LVL3 (GOWN DISPOSABLE) ×2
GOWN STRL REUS W/TWL XL LVL3 (GOWN DISPOSABLE) ×1
HEMOVAC 400CC 10FR (MISCELLANEOUS) ×2 IMPLANT
HOLDER FOLEY CATH W/STRAP (MISCELLANEOUS) ×2 IMPLANT
HOLSTER ELECTROSUGICAL PENCIL (MISCELLANEOUS) ×2 IMPLANT
IV NS IRRIG 3000ML ARTHROMATIC (IV SOLUTION) ×2 IMPLANT
KIT TURNOVER KIT A (KITS) ×2 IMPLANT
KNIFE SCULPS 14X20 (INSTRUMENTS) ×2 IMPLANT
LABEL OR SOLS (LABEL) ×1 IMPLANT
MANIFOLD NEPTUNE II (INSTRUMENTS) ×4 IMPLANT
NDL SAFETY ECLIPSE 18X1.5 (NEEDLE) ×1 IMPLANT
NDL SPNL 20GX3.5 QUINCKE YW (NEEDLE) ×2 IMPLANT
NEEDLE HYPO 18GX1.5 SHARP (NEEDLE) ×1
NEEDLE SPNL 20GX3.5 QUINCKE YW (NEEDLE) ×4 IMPLANT
NS IRRIG 500ML POUR BTL (IV SOLUTION) ×2 IMPLANT
PACK TOTAL KNEE (MISCELLANEOUS) ×2 IMPLANT
PAD ABD DERMACEA PRESS 5X9 (GAUZE/BANDAGES/DRESSINGS) ×4 IMPLANT
PAD WRAPON POLAR KNEE (MISCELLANEOUS) ×1 IMPLANT
PENCIL SMOKE EVACUATOR COATED (MISCELLANEOUS) ×2 IMPLANT
PIN DRILL FIX HALF THREAD (BIT) ×4 IMPLANT
PIN FIXATION 1/8DIA X 3INL (PIN) ×2 IMPLANT
PULSAVAC PLUS IRRIG FAN TIP (DISPOSABLE) ×2
SOL PREP PVP 2OZ (MISCELLANEOUS) ×2
SOLUTION PREP PVP 2OZ (MISCELLANEOUS) ×1 IMPLANT
SOLUTION PRONTOSAN WOUND 350ML (IRRIGATION / IRRIGATOR) ×2 IMPLANT
SPONGE DRAIN TRACH 4X4 STRL 2S (GAUZE/BANDAGES/DRESSINGS) ×2 IMPLANT
SPONGE T-LAP 18X18 ~~LOC~~+RFID (SPONGE) ×6 IMPLANT
STAPLER SKIN PROX 35W (STAPLE) ×2 IMPLANT
STOCKINETTE IMPERV 14X48 (MISCELLANEOUS) IMPLANT
STRAP TIBIA SHORT (MISCELLANEOUS) ×2 IMPLANT
SUCTION FRAZIER HANDLE 10FR (MISCELLANEOUS) ×1
SUCTION TUBE FRAZIER 10FR DISP (MISCELLANEOUS) ×1 IMPLANT
SUT VIC AB 0 CT1 36 (SUTURE) ×4 IMPLANT
SUT VIC AB 1 CT1 36 (SUTURE) ×4 IMPLANT
SUT VIC AB 2-0 CT2 27 (SUTURE) ×2 IMPLANT
SYR 20ML LL LF (SYRINGE) ×2 IMPLANT
SYR 30ML LL (SYRINGE) ×4 IMPLANT
TIBIAL BASE ROT PLAT SZ 8 KNEE (Knees) ×2 IMPLANT
TIP FAN IRRIG PULSAVAC PLUS (DISPOSABLE) ×1 IMPLANT
TOWEL OR 17X26 4PK STRL BLUE (TOWEL DISPOSABLE) ×1 IMPLANT
TOWER CARTRIDGE SMART MIX (DISPOSABLE) ×2 IMPLANT
TRAY FOLEY MTR SLVR 16FR STAT (SET/KITS/TRAYS/PACK) ×2 IMPLANT
WATER STERILE IRR 500ML POUR (IV SOLUTION) ×2 IMPLANT
WRAPON POLAR PAD KNEE (MISCELLANEOUS) ×2

## 2021-06-09 NOTE — Progress Notes (Signed)
OT Cancellation Note  Patient Details Name: Willie Patton MRN: [id number] DOB: 22-Apr-1966   Cancelled Treatment:    Reason Eval/Treat Not Completed: Patient not medically ready Pt still with decreased DF/PF strength at this time, will f/u for OT evaluation at later date/time as he becomes more appropriate/as able.   Rejeana Brock, MS, OTR/L ascom [phone] 06/09/21, 5:34 PM

## 2021-06-09 NOTE — Progress Notes (Signed)
PT Cancellation Note  Patient Details Name: Willie Patton MRN: [id number] DOB: 1965-10-29   Cancelled Treatment:    Reason Eval/Treat Not Completed: Other (comment): Per nursing pt with weak PF and no DF at surgical ankle.  Will attempt to see pt at a future date/time as medically appropriate.    [name] PT, DPT 06/09/21, 4:30 PM

## 2021-06-09 NOTE — TOC Progression Note (Signed)
Transition of Care [facility]) - Progression Note    Patient Details  Name: Willie Patton MRN: [id number] Date of Birth: [birth year]  Transition of Care [facility]) CM/SW Contact  Marlowe Sax, RN Phone Number: 06/09/2021, 1:26 PM  Clinical Narrative:   Patient from home, Plans to DC home with family, Is set up with [facility] for [facility] services, [employer] will bring a 3 in 1 to the room prior to DC He has a rolling walker at home  He can afford his medication He has transportation         Expected Discharge Plan and Services                                                 Social Determinants of Health (SDOH) Interventions    Readmission Risk Interventions No flowsheet data found.

## 2021-06-09 NOTE — H&P (Signed)
The patient has been re-examined, and the chart reviewed, and there have been no interval changes to the documented history and physical.    The risks, benefits, and alternatives have been discussed at length. The patient expressed understanding of the risks benefits and agreed with plans for surgical intervention.  Tuff Clabo P. Kerryn Tennant, Jr. M.D.    

## 2021-06-09 NOTE — Anesthesia Preprocedure Evaluation (Signed)
Anesthesia Evaluation  Patient identified by MRN, date of birth, ID band Patient awake    Reviewed: Allergy & Precautions, H&P , NPO status , Patient's Chart, lab work & pertinent test results, reviewed documented beta blocker date and time   History of Anesthesia Complications (+) PONV and history of anesthetic complications  Airway Mallampati: III  TM Distance: >3 FB Neck ROM: full    Dental  (+) Teeth Intact   Pulmonary neg pulmonary ROS,    Pulmonary exam normal        Cardiovascular Exercise Tolerance: Good hypertension, On Medications (-) anginaNormal cardiovascular exam Rhythm:regular Rate:Normal     Neuro/Psych PSYCHIATRIC DISORDERS  Neuromuscular disease    GI/Hepatic Neg liver ROS, GERD  Medicated,  Endo/Other  negative endocrine ROS  Renal/GU negative Renal ROS  negative genitourinary   Musculoskeletal   Abdominal   Peds  Hematology negative hematology ROS (+)   Anesthesia Other Findings Past Medical History: No date: ADHD (attention deficit hyperactivity disorder) 04/08/2017: Anginal pain (HCC)     Comment:  atypical chest pain No date: Arthritis No date: Back pain, chronic No date: GERD (gastroesophageal reflux disease) No date: Herpes labialis No date: Hypertension No date: PONV (postoperative nausea and vomiting) Past Surgical History: No date: CERVICAL SPINE SURGERY No date: COLONOSCOPY No date: HERNIA REPAIR     Comment:  umbilical and ventral No date: KNEE ARTHROSCOPY No date: VASECTOMY BMI    Body Mass Index: 28.45 kg/m     Reproductive/Obstetrics negative OB ROS                             Anesthesia Physical Anesthesia Plan  ASA: 2  Anesthesia Plan: Regional and General   Post-op Pain Management:    Induction:   PONV Risk Score and Plan: 4 or greater and Dexamethasone  Airway Management Planned:   Additional Equipment:   Intra-op Plan:    Post-operative Plan:   Informed Consent: I have reviewed the patients History and Physical, chart, labs and discussed the procedure including the risks, benefits and alternatives for the proposed anesthesia with the patient or authorized representative who has indicated his/her understanding and acceptance.     Dental Advisory Given  Plan Discussed with: CRNA  Anesthesia Plan Comments:         Anesthesia Quick Evaluation

## 2021-06-09 NOTE — Op Note (Signed)
OPERATIVE NOTE  DATE OF SURGERY:  06/09/2021  PATIENT NAME:  Willie Patton   DOB: March 31, 1966  MRN: [id number]  PRE-OPERATIVE DIAGNOSIS: Degenerative arthrosis of the left knee, primary  POST-OPERATIVE DIAGNOSIS:  Same  PROCEDURE:  Left total knee arthroplasty using computer-assisted navigation  SURGEON:  Jena Gauss. M.D.  ANESTHESIA: spinal  ESTIMATED BLOOD LOSS: 50 mL  FLUIDS REPLACED: 1500 mL of crystalloid  TOURNIQUET TIME: 114 minutes  DRAINS: 2 medium Hemovac drains  SOFT TISSUE RELEASES: Anterior cruciate ligament, posterior cruciate ligament, deep medial collateral ligament, patellofemoral ligament  IMPLANTS UTILIZED: DePuy Attune size 8 posterior stabilized femoral component (cemented), size 8 rotating platform tibial component (cemented), 41 mm medialized dome patella (cemented), and a 7 mm stabilized rotating platform polyethylene insert.  INDICATIONS FOR SURGERY: Willie Patton is a 56 y.o. year old male with a long history of progressive knee pain. X-rays demonstrated severe degenerative changes in tricompartmental fashion. The patient had not seen any significant improvement despite conservative nonsurgical intervention. After discussion of the risks and benefits of surgical intervention, the patient expressed understanding of the risks benefits and agree with plans for total knee arthroplasty.   The risks, benefits, and alternatives were discussed at length including but not limited to the risks of infection, bleeding, nerve injury, stiffness, blood clots, the need for revision surgery, cardiopulmonary complications, among others, and they were willing to proceed.  PROCEDURE IN DETAIL: The patient was brought into the operating room and, after adequate spinal anesthesia was achieved, a tourniquet was placed on the patient's upper thigh. The patient's knee and leg were cleaned and prepped with alcohol and DuraPrep and draped in the usual sterile fashion. A "timeout" was  performed as per usual protocol. The lower extremity was exsanguinated using an Esmarch, and the tourniquet was inflated to 300 mmHg. An anterior longitudinal incision was made followed by a standard mid vastus approach. The deep fibers of the medial collateral ligament were elevated in a subperiosteal fashion off of the medial flare of the tibia so as to maintain a continuous soft tissue sleeve. The patella was subluxed laterally and the patellofemoral ligament was incised. Inspection of the knee demonstrated severe degenerative changes with full-thickness loss of articular cartilage. Osteophytes were debrided using a rongeur. Anterior and posterior cruciate ligaments were excised. Two 4.0 mm Schanz pins were inserted in the femur and into the tibia for attachment of the array of trackers used for computer-assisted navigation. Hip center was identified using a circumduction technique. Distal landmarks were mapped using the computer. The distal femur and proximal tibia were mapped using the computer. The distal femoral cutting guide was positioned using computer-assisted navigation so as to achieve a 5 distal valgus cut. The femur was sized and it was felt that a size 8 femoral component was appropriate. A size 8 femoral cutting guide was positioned and the anterior cut was performed and verified using the computer. This was followed by completion of the posterior and chamfer cuts. Femoral cutting guide for the central box was then positioned in the center box cut was performed.  Attention was then directed to the proximal tibia. Medial and lateral menisci were excised. The extramedullary tibial cutting guide was positioned using computer-assisted navigation so as to achieve a 0 varus-valgus alignment and 3 posterior slope. The cut was performed and verified using the computer. The proximal tibia was sized and it was felt that a size 8 tibial tray was appropriate. Tibial and femoral trials were inserted  followed  by insertion of a 7 mm polyethylene insert. This allowed for excellent mediolateral soft tissue balancing both in flexion and in full extension. Finally, the patella was cut and prepared so as to accommodate a 41 mm medialized dome patella. A patella trial was placed and the knee was placed through a range of motion with excellent patellar tracking appreciated. The femoral trial was removed after debridement of posterior osteophytes. The central post-hole for the tibial component was reamed followed by insertion of a keel punch. Tibial trials were then removed. Cut surfaces of bone were irrigated with copious amounts of normal saline using pulsatile lavage and then suctioned dry. Polymethylmethacrylate cement with gentamicin was prepared in the usual fashion using a vacuum mixer. Cement was applied to the cut surface of the proximal tibia as well as along the undersurface of a size 8 rotating platform tibial component. Tibial component was positioned and impacted into place. Excess cement was removed using Personal assistant. Cement was then applied to the cut surfaces of the femur as well as along the posterior flanges of the size 8 femoral component. The femoral component was positioned and impacted into place. Excess cement was removed using Personal assistant. A 7 mm polyethylene trial was inserted and the knee was brought into full extension with steady axial compression applied. Finally, cement was applied to the backside of a 41 mm medialized dome patella and the patellar component was positioned and patellar clamp applied. Excess cement was removed using Personal assistant. After adequate curing of the cement, the tourniquet was deflated after a total tourniquet time of 114 minutes. Hemostasis was achieved using electrocautery. The knee was irrigated with copious amounts of normal saline using pulsatile lavage followed by 350 ml of Prontosan and then suctioned dry. 20 mL of 1.3% Exparel and 60 mL of 0.25%  Marcaine in 40 mL of normal saline was injected along the posterior capsule, medial and lateral gutters, and along the arthrotomy site. A 7 mm stabilized rotating platform polyethylene insert was inserted and the knee was placed through a range of motion with excellent mediolateral soft tissue balancing appreciated and excellent patellar tracking noted. 2 medium drains were placed in the wound bed and brought out through separate stab incisions. The medial parapatellar portion of the incision was reapproximated using interrupted sutures of #1 Vicryl. Subcutaneous tissue was approximated in layers using first #0 Vicryl followed #2-0 Vicryl. The skin was approximated with skin staples. A sterile dressing was applied.  The patient tolerated the procedure well and was transported to the recovery room in stable condition.    Juli Odom P. Angie Fava., M.D.

## 2021-06-09 NOTE — Progress Notes (Signed)
PT Cancellation Note  Patient Details Name: Willie Patton MRN: [id number] DOB: Jan 03, 1966   Cancelled Treatment:    Reason Eval/Treat Not Completed: Other (comment).  PT consult received.  Chart reviewed.  S/p spinal anesthesia.  Pt resting in bed upon PT arrival; unable to DF or PF B ankles.  Deferred PT evaluation d/t pt without adequate strength/sensation back for safe out of bed functional mobility.  Will re-attempt PT evaluation later today (by a different physical therapist).  Hendricks Limes, PT 06/09/21, 3:30 PM

## 2021-06-09 NOTE — Transfer of Care (Signed)
Immediate Anesthesia Transfer of Care Note  Patient: Willie Patton  Procedure(s) Performed: COMPUTER ASSISTED TOTAL KNEE ARTHROPLASTY - RNFA (Left: Knee)  Patient Location: PACU  Anesthesia Type:Spinal  Level of Consciousness: awake, alert  and oriented  Airway & Oxygen Therapy: Patient Spontanous Breathing and Patient connected to face mask oxygen  Post-op Assessment: Report given to RN and Post -op Vital signs reviewed and stable  Post vital signs: Reviewed and stable  Last Vitals:  Vitals Value Taken Time  BP    Temp    Pulse 85 06/09/21 1239  Resp 13 06/09/21 1239  SpO2 100 % 06/09/21 1239  Vitals shown include unvalidated device data.  Last Pain:  Vitals:   06/09/21 0620  PainSc: 5          Complications: No notable events documented.

## 2021-06-09 NOTE — Anesthesia Procedure Notes (Signed)
Spinal  Patient location during procedure: OR Start time: 06/09/2021 8:25 AM End time: 06/09/2021 8:27 AM Reason for block: surgical anesthesia Staffing Performed: resident/CRNA  Anesthesiologist: Molli Barrows, MD Resident/CRNA: Tollie Eth, CRNA Preanesthetic Checklist Completed: patient identified, IV checked, site marked, risks and benefits discussed, surgical consent, monitors and equipment checked, pre-op evaluation and timeout performed Spinal Block Patient position: sitting Prep: Betadine Patient monitoring: heart rate, continuous pulse ox, blood pressure and cardiac monitor Approach: midline Location: L4-5 Injection technique: single-shot Needle Needle type: Whitacre and Introducer  Needle gauge: 24 G Needle length: 9 cm Assessment Events: CSF return Additional Notes Negative paresthesia. Negative blood return. Positive free-flowing CSF. Expiration date of kit checked and confirmed. Patient tolerated procedure well, without complications.

## 2021-06-10 DIAGNOSIS — M1712 Unilateral primary osteoarthritis, left knee: Secondary | ICD-10-CM | POA: Diagnosis not present

## 2021-06-10 MED ORDER — OXYCODONE HCL 5 MG PO TABS
5.0000 mg | ORAL_TABLET | ORAL | 0 refills | Status: DC | PRN
Start: 2021-06-10 — End: 2022-03-23

## 2021-06-10 MED ORDER — SODIUM CHLORIDE 0.9 % IV SOLN: INTRAVENOUS | Status: DC | PRN

## 2021-06-10 MED ORDER — ENOXAPARIN SODIUM 40 MG/0.4ML IJ SOSY: 40.0000 mg | PREFILLED_SYRINGE | INTRAMUSCULAR | 0 refills | Status: DC

## 2021-06-10 MED ORDER — TRAMADOL HCL 50 MG PO TABS: 50.0000 mg | ORAL_TABLET | ORAL | 0 refills | Status: DC | PRN

## 2021-06-10 MED ORDER — ONDANSETRON HCL 4 MG PO TABS: 4.0000 mg | ORAL_TABLET | Freq: Four times a day (QID) | ORAL | 0 refills | Status: DC | PRN

## 2021-06-10 MED ORDER — SENNOSIDES-DOCUSATE SODIUM 8.6-50 MG PO TABS: 1.0000 | ORAL_TABLET | Freq: Two times a day (BID) | ORAL | 0 refills | Status: DC

## 2021-06-10 MED ORDER — CELECOXIB 200 MG PO CAPS
200.0000 mg | ORAL_CAPSULE | Freq: Two times a day (BID) | ORAL | 0 refills | Status: DC
Start: 2021-06-10 — End: 2022-03-23

## 2021-06-10 NOTE — Evaluation (Signed)
Physical Therapy Evaluation Patient Details Name: Willie Patton MRN: [id number] DOB: 18-Mar-1966 Today's Date: 06/10/2021  History of Present Illness  Pt is a 56 y/o M who underwent scheduled L TKA on 06/09/21 by Dr. Ernest Pine. PMH: ADHD, chronic back pain, HTN  Clinical Impression  Pt seen for PT evaluation. PT educated pt on importance of knee extension & provided pt with TKA HEP. Pt performs all exercises with minimal cuing for technique. Pt is mod I for bed mobility, transfers & to ambulate 1 lap around the nurses station with RW. Pt negotiates 4 steps with R rail & CGA with compensatory pattern. At this time pt is safe to d/c home with wife's assistance. Will continue to follow pt acutely to address L knee ROM, stair negotiation, and gait with LRAD.        Recommendations for follow up therapy are one component of a multi-disciplinary discharge planning process, led by the attending physician.  Recommendations may be updated based on patient status, additional functional criteria and insurance authorization.  Follow Up Recommendations Outpatient PT    Assistance Recommended at Discharge Set up Supervision/Assistance  Patient can return home with the following  Assistance with cooking/housework;Assist for transportation;Help with stairs or ramp for entrance    Equipment Recommendations None recommended by PT (pt already has a RW)  Recommendations for Other Services       Functional Status Assessment Patient has had a recent decline in their functional status and demonstrates the ability to make significant improvements in function in a reasonable and predictable amount of time.     Precautions / Restrictions Precautions Precautions: Fall;Knee Restrictions Weight Bearing Restrictions: Yes LLE Weight Bearing: Weight bearing as tolerated      Mobility  Bed Mobility Overal bed mobility: Modified Independent             General bed mobility comments: supine<>sit with HOB elevated,  uses RLE to assist LLE onto bed with sit>supine    Transfers Overall transfer level: Modified independent Equipment used: Rolling walker (2 wheels)               General transfer comment: Pushes to stand with 1UE on bed    Ambulation/Gait Ambulation/Gait assistance: Modified independent (Device/Increase time) Gait Distance (Feet): 175 Feet Assistive device: Rolling walker (2 wheels) Gait Pattern/deviations: Decreased step length - left;Decreased dorsiflexion - left       General Gait Details: decreased L knee & hip flexion during swing phase  Stairs Stairs: Yes Stairs assistance: Min guard Stair Management: One rail Right;Step to pattern Number of Stairs: 4 General stair comments: Education re: hand placement & compensatory sequencing. Pt with 1 episode of L knee buckling when ascending but able to recover with CGA. Educated pt on ability for his wife to provide assistance with stair negotiation PRN upon return home.  Wheelchair Mobility    Modified Rankin (Stroke Patients Only)       Balance Overall balance assessment: Needs assistance Sitting-balance support: Feet supported Sitting balance-Leahy Scale: Normal     Standing balance support: During functional activity;No upper extremity supported Standing balance-Leahy Scale: Fair Standing balance comment: supervision static standing without AD                             Pertinent Vitals/Pain Pain Assessment: 0-10 Pain Score: 8  Pain Location: medial aspect of L knee/distal thigh with flexion exercises Pain Descriptors / Indicators: Discomfort;Grimacing Pain Intervention(s): Monitored during session;Premedicated before  session    Home Living Family/patient expects to be discharged to:: Private residence Living Arrangements: Spouse/significant other Available Help at Discharge: Family;Available 24 hours/day Type of Home: House Home Access: Stairs to enter Entrance Stairs-Rails:  Right Entrance Stairs-Number of Steps: 2   Home Layout: One level (bonus room upstairs) Home Equipment: Agricultural consultant (2 wheels);Rollator (4 wheels)      Prior Function Prior Level of Function : Independent/Modified Independent;Working/employed;Driving             Mobility Comments: Independent without AD, working as a Scientific laboratory technician        Extremity/Trunk Assessment   Upper Extremity Assessment Upper Extremity Assessment: Overall WFL for tasks assessed    Lower Extremity Assessment Lower Extremity Assessment: LLE deficits/detail LLE Deficits / Details: strength not formally tested, 3/5 knee extension in sitting, 1 episode of L knee buckling with stair negotiation with pt able to recover with CGA    Cervical / Trunk Assessment Cervical / Trunk Assessment: Normal  Communication      Cognition Arousal/Alertness: Awake/alert Behavior During Therapy: WFL for tasks assessed/performed Overall Cognitive Status: Within Functional Limits for tasks assessed                                 General Comments: Requires occasional cuing to slow down with activity as pt moves with increased speed        General Comments General comments (skin integrity, edema, etc.): Briefly educated pt on polar care & use of it.    Exercises Total Joint Exercises Ankle Circles/Pumps: AROM;Both;10 reps;Supine Quad Sets: AROM;Strengthening;Left;10 reps;Supine Short Arc Quad: AROM;Strengthening;Left;10 reps;Supine Heel Slides: AROM;Strengthening;Left;10 reps;Supine Hip ABduction/ADduction: AROM;Strengthening;Left;10 reps;Supine Straight Leg Raises: AROM;Strengthening;Left;10 reps;Supine Long Arc Quad: AROM;Strengthening;Left;10 reps;Supine Knee Flexion: AROM;Left;10 reps;Seated Goniometric ROM: 10 degrees from full extension in supine, 95 degrees L knee flexion in sitting   Assessment/Plan    PT Assessment Patient needs continued PT services  PT Problem List  Decreased strength;Decreased mobility;Decreased range of motion;Decreased activity tolerance;Decreased knowledge of use of DME;Decreased balance;Pain       PT Treatment Interventions DME instruction;Therapeutic exercise;Gait training;Balance training;Stair training;Neuromuscular re-education;Modalities;Manual techniques;Patient/family education;Functional mobility training;Therapeutic activities    PT Goals (Current goals can be found in the Care Plan section)  Acute Rehab PT Goals Patient Stated Goal: go home PT Goal Formulation: With patient Time For Goal Achievement: 06/24/21 Potential to Achieve Goals: Good    Frequency BID     Co-evaluation               AM-PAC PT "6 Clicks" Mobility  Outcome Measure Help needed turning from your back to your side while in a flat bed without using bedrails?: None Help needed moving from lying on your back to sitting on the side of a flat bed without using bedrails?: None Help needed moving to and from a bed to a chair (including a wheelchair)?: None Help needed standing up from a chair using your arms (e.g., wheelchair or bedside chair)?: None Help needed to walk in hospital room?: None Help needed climbing 3-5 steps with a railing? : A Little 6 Click Score: 23    End of Session   Activity Tolerance: Patient tolerated treatment well Patient left: in bed;with call bell/phone within reach   PT Visit Diagnosis: Muscle weakness (generalized) (M62.81);Other abnormalities of gait and mobility (R26.89)    Time: 9233-0076 PT Time Calculation (min) (ACUTE ONLY):  17 min   Charges:   PT Evaluation $PT Eval Low Complexity: 1 Low PT Treatments $Therapeutic Activity: 8-22 mins        Aleda GranaVictoria Nimrat Woolworth, PT, DPT 06/10/21, 9:09 AM   Sandi MariscalVictoria M Andreya Lacks 06/10/2021, 9:08 AM

## 2021-06-10 NOTE — Plan of Care (Signed)

## 2021-06-10 NOTE — Plan of Care (Signed)
Patient discharged home per MD orders at this time.All discharge instructions,education and medications reviewed with the patient at the bedside.Pt expressed understanding and will comply with dc instructions.follow up appointments was also communicated to the patient.,no verbal c/o or any ssx of distress.patient was discharged home with HH/PT services per order.patient was transported home by wife in a privately owned vehicle.

## 2021-06-10 NOTE — TOC Transition Note (Signed)
Transition of Care [facility]) - CM/SW Discharge Note   Patient Details  Name: Willie Patton MRN: [id number] Date of Birth: 09-16-1965  Transition of Care [facility] CM/SW Contact:  Harriet Masson, RN Phone Number: 06/10/2021, 9:10 AM   Clinical Narrative:    RN spoke with pt today and confirm the DME 3-1 has been delivered to the room. Also alerted [facility] ([place] that pt will be discharging home today.  Pt has been updated accordingly. No further needs.         Patient Goals and CMS Choice        Discharge Placement                       Discharge Plan and Services                                     Social Determinants of Health (SDOH) Interventions     Readmission Risk Interventions No flowsheet data found.

## 2021-06-10 NOTE — Evaluation (Signed)
Occupational Therapy Evaluation Patient Details Name: Willie Patton MRN: [id number] DOB: 04-09-1966 Today's Date: 06/10/2021   History of Present Illness Pt is a 56 y/o M who underwent scheduled L TKA on 06/09/21 by Dr. Marry Guan. PMH: ADHD, chronic back pain, HTN   Clinical Impression   Pt seen for OT evaluation this date s/p elective L TKA. He presents this date with good sensation to L LE and his spouse presents to room at time of OT evaluation. On OT assessment, pt requires increased time to perform LB ADLs including threading socks and underwear, but is able to succeed with increased time and ed re: modified technique. Does require MAX A for compression stocking mgt and MIN A for polar care mgt. Spouse present and trained in use of these items. Pt left in bed end of session with all needs met. OT handout provided. No further OT needs.    Recommendations for follow up therapy are one component of a multi-disciplinary discharge planning process, led by the attending physician.  Recommendations may be updated based on patient status, additional functional criteria and insurance authorization.   Follow Up Recommendations  No OT follow up    Assistance Recommended at Discharge Set up Supervision/Assistance  Patient can return home with the following      Functional Status Assessment  Patient has not had a recent decline in their functional status  Equipment Recommendations  Tub/shower seat    Recommendations for Other Services       Precautions / Restrictions Precautions Precautions: Fall;Knee Restrictions Weight Bearing Restrictions: Yes LLE Weight Bearing: Weight bearing as tolerated      Mobility Bed Mobility Overal bed mobility: Modified Independent             General bed mobility comments: HOB slightly elevated, no use of railing    Transfers Overall transfer level: Modified independent Equipment used: Rolling walker (2 wheels)               General transfer  comment: one cue for safe use of RW, pt demos good carryover      Balance Overall balance assessment: Modified Independent Sitting-balance support: Feet supported Sitting balance-Leahy Scale: Normal     Standing balance support: Reliant on assistive device for balance Standing balance-Leahy Scale: Good Standing balance comment: UE support for fxl mobility, able to stand w/o UE support for static standing                           ADL either performed or assessed with clinical judgement   ADL                                         General ADL Comments: requires increased time to perform LB ADLs including threading socks and underwear, but is able to succeed with increased time and ed re: modified technique. Does require MAX A for compression stocking mgt and MIN A for polar care mgt. Spouse present and trained in use of these items.     Vision Patient Visual Report: No change from baseline       Perception     Praxis      Pertinent Vitals/Pain Pain Assessment: 0-10 Pain Score: 5  Pain Location: medial aspect of L knee/distal thigh with flexion exercises Pain Descriptors / Indicators: Discomfort;Grimacing Pain Intervention(s): Premedicated before session;Monitored during session  Hand Dominance     Extremity/Trunk Assessment Upper Extremity Assessment Upper Extremity Assessment: Overall WFL for tasks assessed   Lower Extremity Assessment Lower Extremity Assessment: Defer to PT evaluation;LLE deficits/detail LLE Deficits / Details: adequate for LB ADLs with increased time, in sitting   Cervical / Trunk Assessment Cervical / Trunk Assessment: Normal   Communication Communication Communication: No difficulties   Cognition Arousal/Alertness: Awake/alert Behavior During Therapy: WFL for tasks assessed/performed Overall Cognitive Status: Within Functional Limits for tasks assessed                                 General  Comments: cues for safety, some mildly decreasd awareness     General Comments  Briefly educated pt on polar care & use of it.    Exercises Total Joint Exercises Ankle Circles/Pumps: AROM;Both;10 reps;Supine Quad Sets: AROM;Strengthening;Left;10 reps;Supine Short Arc Quad: AROM;Strengthening;Left;10 reps;Supine Heel Slides: AROM;Strengthening;Left;10 reps;Supine Hip ABduction/ADduction: AROM;Strengthening;Left;10 reps;Supine Straight Leg Raises: AROM;Strengthening;Left;10 reps;Supine Long Arc Quad: AROM;Strengthening;Left;10 reps;Supine Knee Flexion: AROM;Left;10 reps;Seated Goniometric ROM: 10 degrees from full extension in supine, 95 degrees L knee flexion in sitting Other Exercises Other Exercises: OT ed with pt and spouse re: compression stocking and polar care mgt, LB ADL modifications   Shoulder Instructions      Home Living Family/patient expects to be discharged to:: Private residence Living Arrangements: Spouse/significant other Available Help at Discharge: Family;Available 24 hours/day Type of Home: House Home Access: Stairs to enter [employer] of Steps: 2 Entrance Stairs-Rails: Right Home Layout: One level (bonus room upstairs)               Home Equipment: Conservation officer, nature (2 wheels);Rollator (4 wheels)          Prior Functioning/Environment Prior Level of Function : Independent/Modified Independent;Working/employed;Driving             Mobility Comments: Independent without AD, working as a Training and development officer Problem List: Decreased knowledge of use of DME or AE;Decreased range of motion      OT Treatment/Interventions: Self-care/ADL training;Therapeutic activities    OT Goals(Current goals can be found in the care plan section) Acute Rehab OT Goals Patient Stated Goal: to go home OT Goal Formulation: All assessment and education complete, DC therapy  OT Frequency:      Co-evaluation              AM-PAC OT "6 Clicks"  Daily Activity     Outcome Measure Help from another person eating meals?: None Help from another person taking care of personal grooming?: None Help from another person toileting, which includes using toliet, bedpan, or urinal?: None Help from another person bathing (including washing, rinsing, drying)?: A Little Help from another person to put on and taking off regular upper body clothing?: None Help from another person to put on and taking off regular lower body clothing?: A Little 6 Click Score: 22   End of Session Equipment Utilized During Treatment: Rolling walker (2 wheels);Gait belt Nurse Communication: Mobility status  Activity Tolerance: Patient tolerated treatment well Patient left: in bed;with call bell/phone within reach;with family/visitor present  OT Visit Diagnosis: Other abnormalities of gait and mobility (R26.89)                Time: 4128-7867 OT Time Calculation (min): 12 min Charges:  OT General Charges $OT Visit: 1 Visit OT Evaluation $OT Eval Low Complexity: 1 Low  Gerrianne Scale, [place], OTR/L ascom [phone] 06/10/21, 12:10 PM

## 2021-06-10 NOTE — Progress Notes (Signed)
° °  Subjective: 1 Day Post-Op Procedure(s) (LRB): COMPUTER ASSISTED TOTAL KNEE ARTHROPLASTY - RNFA (Left) Patient reports pain as mild.   Patient is well, and has had no acute complaints or problems Denies any CP, SOB, ABD pain. We will continue therapy today.  Plan is to go Home after hospital stay.  Objective: Vital signs in last 24 hours: Temp:  [97 F (36.1 C)-98.3 F (36.8 C)] 98 F (36.7 C) (01/07 0342) Pulse Rate:  [55-125] 59 (01/07 0342) Resp:  [11-20] 15 (01/07 0342) BP: (93-125)/(63-83) 112/78 (01/07 0342) SpO2:  [96 %-100 %] 99 % (01/07 0342)  Intake/Output from previous day: 01/06 0701 - 01/07 0700 In: 2340 [P.O.:240; I.V.:1500; IV Piggyback:600] Out: 1260 [Urine:900; Drains:310; Blood:50] Intake/Output this shift: No intake/output data recorded.  No results for input(s): HGB in the last 72 hours. No results for input(s): WBC, RBC, HCT, PLT in the last 72 hours. No results for input(s): NA, K, CL, CO2, BUN, CREATININE, GLUCOSE, CALCIUM in the last 72 hours. No results for input(s): LABPT, INR in the last 72 hours.  EXAM General - Patient is Alert, Appropriate, and Oriented Extremity - Neurovascular intact Sensation intact distally Intact pulses distally Dorsiflexion/Plantar flexion intact No cellulitis present Compartment soft Able to straight leg raise. Hemovac intact, Hemovac removed Dressing - dressing C/D/I and no drainage Motor Function - intact, moving foot and toes well on exam.   Past Medical History:  Diagnosis Date   ADHD (attention deficit hyperactivity disorder)    Anginal pain ([place]) 04/08/2017   atypical chest pain   Arthritis    Back pain, chronic    GERD (gastroesophageal reflux disease)    Herpes labialis    Hypertension    PONV (postoperative nausea and vomiting)     Assessment/Plan:   1 Day Post-Op Procedure(s) (LRB): COMPUTER ASSISTED TOTAL KNEE ARTHROPLASTY - RNFA (Left) Principal Problem:   Total knee replacement  status  Estimated body mass index is 28.45 kg/m as calculated from the following:   Height as of 05/26/21: 5\' 10"  (1.778 m).   Weight as of this encounter: 89.9 kg. Advance diet Up with therapy Patient doing well this morning.  Pain well controlled Vital signs stable Care management to assist with discharge to home with home health PT today pending completion of PT goals.  DVT Prophylaxis - Lovenox, TED hose, and SCDs Weight-Bearing as tolerated to left leg   T. Rachelle Hora, PA-C [place] 06/10/2021, 7:38 AM

## 2021-06-10 NOTE — Discharge Summary (Signed)
Physician Discharge Summary  Patient ID: Willie Patton MRN: [id number] DOB/AGE: 57/08/1965 57 y.o.  Admit date: 06/09/2021 Discharge date: 06/10/2021  Admission Diagnoses:  Total knee replacement status [Z96.659]   Discharge Diagnoses: Patient Active Problem List   Diagnosis Date Noted   ADHD (attention deficit hyperactivity disorder) 06/09/2021   Total knee replacement status 06/09/2021   Primary osteoarthritis of left knee 03/19/2021   DDD (degenerative disc disease), lumbar 11/30/2020   Erectile dysfunction 05/08/2020   Essential hypertension 05/17/2017   Atypical chest pain 04/08/2017   Chronic midline low back pain with right-sided sciatica 02/11/2017   Elevated PSA 04/25/2016   Inguinal hernia without obstruction or gangrene 07/29/2014   Back pain 05/09/2012   Herpes labialis 05/09/2012    Past Medical History:  Diagnosis Date   ADHD (attention deficit hyperactivity disorder)    Anginal pain ([facility]) 04/08/2017   atypical chest pain   Arthritis    Back pain, chronic    GERD (gastroesophageal reflux disease)    Herpes labialis    Hypertension    PONV (postoperative nausea and vomiting)      Transfusion: none   Consultants (if any):   Discharged Condition: Improved  Hospital Course: Willie Patton is an 56 y.o. male who was admitted 06/09/2021 with a diagnosis of Total knee replacement status and went to the operating room on 06/09/2021 and underwent the above named procedures.    Surgeries: Procedure(s): COMPUTER ASSISTED TOTAL KNEE ARTHROPLASTY - RNFA on 06/09/2021 Patient tolerated the surgery well. Taken to PACU where she was stabilized and then transferred to the orthopedic floor.  Started on Lovenox 30 mg q 12 hrs. SCDs applied. Heels elevated on bed with rolled towels. No evidence of DVT. Negative [place]oman. Physical therapy started on day #1 for gait training and transfer. OT started day #1 for ADL and assisted devices.  Patient completed all PT goals on postop day 1.   Vital signs are stable, patient stable and ready for discharge to home with home health PT  Patient's foley was d/c on day #1. Patient's IV and hemovac was d/c on day #1.  On post op day #1 patient was stable and ready for discharge to home with home health PT.  Implants: DePuy Attune size 8 posterior stabilized femoral component (cemented), size 8 rotating platform tibial component (cemented), 41 mm medialized dome patella (cemented), and a 7 mm stabilized rotating platform polyethylene insert.  He was given perioperative antibiotics:  Anti-infectives (From admission, onward)    Start     Dose/Rate Route Frequency Ordered Stop   06/09/21 1445  ceFAZolin (ANCEF) IVPB 2g/100 mL premix        2 g 200 mL/hr over 30 Minutes Intravenous Every 6 hours 06/09/21 1304 06/09/21 2052   06/09/21 1302  ceFAZolin (ANCEF) 2-4 GM/100ML-% IVPB       Note to Pharmacy: Rayann HemanKizziah, Kaitlin T: cabinet override      06/09/21 1302 06/09/21 2019   06/09/21 0615  ceFAZolin (ANCEF) IVPB 2g/100 mL premix        2 g 200 mL/hr over 30 Minutes Intravenous On call to O.R. 06/09/21 [phone] 06/09/21 0909   06/09/21 0615  ceFAZolin (ANCEF) 2-4 GM/100ML-% IVPB       Note to Pharmacy: Rayann HemanKizziah, Kaitlin T: cabinet override      06/09/21 0615 06/09/21 2052     .  He was given sequential compression devices, early ambulation, and Lovenox, teds for DVT prophylaxis.  He benefited maximally from the hospital stay  and there were no complications.    Recent vital signs:  Vitals:   06/09/21 2315 06/10/21 0342  BP: 100/63 112/78  Pulse: 67 (!) 59  Resp: 16 15  Temp: 98.3 F (36.8 C) 98 F (36.7 C)  SpO2: 99% 99%    Recent laboratory studies:  Lab Results  Component Value Date   HGB 15.5 05/26/2021   Lab Results  Component Value Date   WBC 4.2 05/26/2021   PLT 313 05/26/2021   Lab Results  Component Value Date   INR 0.9 05/26/2021   Lab Results  Component Value Date   NA 136 05/26/2021   K 3.9 05/26/2021    CL 102 05/26/2021   CO2 29 05/26/2021   BUN 18 05/26/2021   CREATININE 0.71 05/26/2021   GLUCOSE 111 (H) 05/26/2021    Discharge Medications:   Allergies as of 06/10/2021       Reactions   Penicillins Hives   IgE = 47 (WNL) on 05/26/2021        Medication List     STOP taking these medications    meloxicam 15 MG tablet Commonly known as: MOBIC       TAKE these medications    celecoxib 200 MG capsule Commonly known as: CELEBREX Take 1 capsule (200 mg total) by mouth 2 (two) times daily.   cetirizine 10 MG tablet Commonly known as: ZYRTEC Take 10 mg by mouth daily.   diclofenac Sodium 1 % Gel Commonly known as: VOLTAREN Apply 2 g topically daily as needed (knee pain).   enoxaparin 40 MG/0.4ML injection Commonly known as: LOVENOX Inject 0.4 mLs (40 mg total) into the skin daily for 14 days.   fluticasone 50 MCG/ACT nasal spray Commonly known as: FLONASE Place 2 sprays into both nostrils daily.   gabapentin 300 MG capsule Commonly known as: NEURONTIN Take 300 mg by mouth at bedtime.   losartan 100 MG tablet Commonly known as: COZAAR Take 100 mg by mouth daily.   multivitamin with minerals Tabs tablet Take 1 tablet by mouth daily.   ondansetron 4 MG tablet Commonly known as: ZOFRAN Take 1 tablet (4 mg total) by mouth every 6 (six) hours as needed for nausea.   oxyCODONE 5 MG immediate release tablet Commonly known as: Oxy IR/ROXICODONE Take 1-2 tablets (5-10 mg total) by mouth every 4 (four) hours as needed for moderate pain (pain score 4-6).   senna-docusate 8.6-50 MG tablet Commonly known as: Senokot-S Take 1 tablet by mouth 2 (two) times daily.   traMADol 50 MG tablet Commonly known as: ULTRAM Take 1-2 tablets (50-100 mg total) by mouth every 4 (four) hours as needed for moderate pain (No more than 8 tablets in 24 hours.).               Durable Medical Equipment  (From admission, onward)           Start     Ordered   06/09/21  1259  DME Walker rolling  Once       Question:  Patient needs a walker to treat with the following condition  Answer:  Total knee replacement status   06/09/21 1258   06/09/21 1259  DME Bedside commode  Once       Question:  Patient needs a bedside commode to treat with the following condition  Answer:  Total knee replacement status   06/09/21 1258            Diagnostic Studies: [facility] Knee Left Port  Result Date: 06/09/2021 CLINICAL DATA:  Status post left knee replacement. EXAM: PORTABLE LEFT KNEE - 1-2 VIEW COMPARISON:  None. FINDINGS: Left femoral and tibial components are well situated. Expected postoperative changes and surgical drain are noted anteriorly. IMPRESSION: Status post left total knee arthroplasty. Electronically Signed   By: Lupita Raider M.D.   On: 06/09/2021 13:16    Disposition:      Follow-up Information     Dedra Skeens, PA-C Follow up on 06/22/2021.   Specialty: Orthopedic Surgery Why: at 1:45pm Contact information: [address] [place] [address] [phone]         Donato Heinz, MD Follow up on 07/25/2021.   Specialty: Orthopedic Surgery Why: at 9:15am Contact information: [address] [facility] [place] [address] [phone]                  Signed: Patience Musca 06/10/2021, 7:44 AM

## 2021-06-12 ENCOUNTER — Encounter: Payer: Self-pay | Admitting: Orthopedic Surgery

## 2021-06-20 NOTE — Anesthesia Postprocedure Evaluation (Signed)
Anesthesia Post Note  Patient: MATTY VANROEKEL  Procedure(s) Performed: COMPUTER ASSISTED TOTAL KNEE ARTHROPLASTY - RNFA (Left: Knee)  Patient location during evaluation: PACU Anesthesia Type: Regional Level of consciousness: awake and alert Pain management: pain level controlled Vital Signs Assessment: post-procedure vital signs reviewed and stable Respiratory status: spontaneous breathing, nonlabored ventilation, respiratory function stable and patient connected to nasal cannula oxygen Cardiovascular status: blood pressure returned to baseline and stable Postop Assessment: no apparent nausea or vomiting Anesthetic complications: no   No notable events documented.   Last Vitals:  Vitals:   06/10/21 0758 06/10/21 1107  BP: (!) 155/82 138/89  Pulse: 65 60  Resp: 17 18  Temp: 36.6 C 36.4 C  SpO2: 100% 100%    Last Pain:  Vitals:   06/10/21 0815  TempSrc:   PainSc: 3                  Yevette Edwards

## 2022-02-11 DIAGNOSIS — M1711 Unilateral primary osteoarthritis, right knee: Secondary | ICD-10-CM | POA: Insufficient documentation

## 2022-03-19 NOTE — Discharge Instructions (Addendum)
Information for Discharge Teaching:  DO NOT [place], April 06, 2022 EXPAREL (bupivacaine liposome injectable suspension)   Your surgeon or anesthesiologist gave you EXPAREL(bupivacaine) to help control your pain after surgery.  EXPAREL is a local anesthetic that provides pain relief by numbing the tissue around the surgical site. EXPAREL is designed to release pain medication over time and can control pain for up to 72 hours. Depending on how you respond to EXPAREL, you may require less pain medication during your recovery.  Possible side effects: Temporary loss of sensation or ability to move in the area where bupivacaine was injected. Nausea, vomiting, constipation Rarely, numbness and tingling in your mouth or lips, lightheadedness, or anxiety may occur. Call your doctor right away if you think you may be experiencing any of these sensations, or if you have other questions regarding possible side effects.  Follow all other discharge instructions given to you by your surgeon or nurse. Eat a healthy diet and drink plenty of water or other fluids.  If you return to the hospital for any reason within 96 hours following the administration of EXPAREL, it is important for health care providers to know that you have received this anesthetic. A teal colored band has been placed on your arm with the date, time and amount of EXPAREL you have received in order to alert and inform your health care providers. Please leave this armband in place for the full 96 hours following administration, and then you may remove the band.   POLAR CARE INFORMATION  [email or web address]  How to use Jackpot Cold Therapy System?  YouTube   [email or web address]  OPERATING INSTRUCTIONS  Start the product With dry hands, connect the transformer to the electrical connection located on the top of the cooler. Next, plug the transformer into an appropriate  electrical outlet. The unit will automatically start running at this point.  To stop the pump, disconnect electrical power.  Unplug to stop the product when not in use. Unplugging the Polar Care unit turns it off. Always unplug immediately after use. Never leave it plugged in while unattended. Remove pad.    FIRST ADD WATER TO FILL LINE, THEN ICE---Replace ice when existing ice is almost melted  1 Discuss Treatment with your [place] Practitioner and Use Only as Prescribed 2 Apply Insulation Barrier & Cold Therapy Pad 3 Check for Moisture 4 Inspect Skin Regularly  Tips and Trouble Shooting Usage Tips 1. Use cubed or chunked ice for optimal performance. 2. It is recommended to drain the Pad between uses. To drain the pad, hold the Pad upright with the hose pointed toward the ground. Depress the black plunger and allow water to drain out. 3. You may disconnect the Pad from the unit without removing the pad from the affected area by depressing the silver tabs on the hose coupling and gently pulling the hoses apart. The Pad and unit will seal itself and will not leak. Note: Some dripping during release is normal. 4. DO NOT RUN PUMP WITHOUT WATER! The pump in this unit is designed to run with water. Running the unit without water will cause permanent damage to the pump. 5. Unplug unit before removing lid.  TROUBLESHOOTING GUIDE Pump not running, Water not flowing to the pad, Pad is not getting cold 1. Make sure the transformer is plugged into the wall outlet. 2. Confirm that the ice and water are filled to the indicated levels. 3. Make sure there are no  kinks in the pad. 4. Gently pull on the blue tube to make sure the tube/pad junction is straight. 5. Remove the pad from the treatment site and ll it while the pad is lying at; then reapply. 6. Confirm that the pad couplings are securely attached to the unit. Listen for the double clicks (Figure 1) to confirm the pad couplings are  securely attached.  Leaks    Note: Some condensation on the lines, controller, and pads is unavoidable, especially in warmer climates. 1. If using a Breg Polar Care Cold Therapy unit with a detachable Cold Therapy Pad, and a leak exists (other than condensation on the lines) disconnect the pad couplings. Make sure the silver tabs on the couplings are depressed before reconnecting the pad to the pump hose; then confirm both sides of the coupling are properly clicked in. 2. If the coupling continues to leak or a leak is detected in the pad itself, stop using it and call [place] at [phone].  Cleaning After use, empty and dry the unit with a soft cloth. Warm water and mild detergent may be used occasionally to clean the pump and tubes.  WARNING: The Crane can be cold enough to cause serious injury, including full skin necrosis. Follow these Operating Instructions, and carefully read the Product Insert (see pouch on side of unit) and the Cold Therapy Pad Fitting Instructions (provided with each Cold Therapy Pad) prior to use.      Instructions after Total Knee Replacement   James P. Holley Bouche., M.D.     Dept. of [facility]  [place], [place]  [address]  Phone: [phone]   Fax: [phone]    DIET: Drink plenty of non-alcoholic fluids. Resume your normal diet. Include foods high in fiber.  ACTIVITY:  You may use crutches or a walker with weight-bearing as tolerated, unless instructed otherwise. You may be weaned off of the walker or crutches by your Physical Therapist.  Do NOT place pillows under the knee. Anything placed under the knee could limit your ability to straighten the knee.   Continue doing gentle exercises. Exercising will reduce the pain and swelling, increase motion, and prevent muscle weakness.   Please continue to use the TED compression stockings for 6 weeks. You may remove the stockings  at night, but should reapply them in the morning. Do not drive or operate any equipment until instructed.  WOUND CARE:  Continue to use the PolarCare or ice packs periodically to reduce pain and swelling. You may bathe or shower after the staples are removed at the first office visit following surgery.  MEDICATIONS: You may resume your regular medications. Please take the pain medication as prescribed on the medication. Do not take pain medication on an empty stomach. You have been given a prescription for a blood thinner (Lovenox or Coumadin). Please take the medication as instructed. (NOTE: After completing a 2 week course of Lovenox, take one Enteric-coated aspirin once a day. This along with elevation will help reduce the possibility of phlebitis in your operated leg.) Do not drive or drink alcoholic beverages when taking pain medications.  CALL THE OFFICE FOR: Temperature above 101 degrees Excessive bleeding or drainage on the dressing. Excessive swelling, coldness, or paleness of the toes. Persistent nausea and vomiting.  FOLLOW-UP:  You should have an appointment to return to the office in 10-14 days after surgery. Arrangements have been made for continuation of Physical  Therapy (either home therapy or outpatient therapy).     [facility] Department Directory         [email or web address]       [email or web address]          Cardiology  Appointments: [place] [phone]  Endocrinology  Appointments: [place] [phone] [place] [phone]  Gastroenterology  Appointments: [place] [phone] [place] [phone]        General Surgery   Appointments: [facility]  Internal Medicine/Family Medicine  Appointments: [facility] [place] - [phone] [place] [phone]  Metabolic and [place] Loss Surgery  Appointments: [facility]         Neurology  Appointments: [place] [phone] [place] - [phone]  Neurosurgery  Appointments: [place]  Obstetrics & Gynecology  Appointments: [place] [phone] [place] - [phone]        Pediatrics  Appointments: [place] [phone] [place] - [phone]  Physiatry  Appointments: [place] [phone]  Physical Therapy  Appointments: [place] [phone]        Podiatry  Appointments: [place] [phone] [place] - [phone]  Pulmonology  Appointments: [place]  Rheumatology  Appointments: [place] [phone]        [place] Location: [facility]  [place], [place]  [address]  [place] Location: [facility] [address] [place], [place]  [address]  [place] Location: [facility] [address] [place], [place]  [phone]

## 2022-03-23 ENCOUNTER — Encounter
Admission: RE | Admit: 2022-03-23 | Discharge: 2022-03-23 | Disposition: A | Discharge status: Home / Self Care | Payer: BC Managed Care – PPO | Source: Ambulatory Visit | Attending: Orthopedic Surgery | Admitting: Orthopedic Surgery

## 2022-03-23 VITALS — BP 148/91 | HR 74 | Resp 14 | Ht 70.0 in | Wt 221.1 lb

## 2022-03-23 DIAGNOSIS — M1711 Unilateral primary osteoarthritis, right knee: Secondary | ICD-10-CM | POA: Diagnosis not present

## 2022-03-23 DIAGNOSIS — Z01818 Encounter for other preprocedural examination: Secondary | ICD-10-CM | POA: Diagnosis present

## 2022-03-23 HISTORY — DX: Other intervertebral disc degeneration, lumbar region: M51.36

## 2022-03-23 HISTORY — DX: Elevated prostate specific antigen (PSA): R97.20

## 2022-03-23 HISTORY — DX: Other complications of anesthesia, initial encounter: T88.59XA

## 2022-03-23 HISTORY — DX: Prediabetes: R73.03

## 2022-03-23 LAB — URINALYSIS, ROUTINE W REFLEX MICROSCOPIC
Bilirubin Urine: NEGATIVE
Glucose, UA: NEGATIVE mg/dL
Hgb urine dipstick: NEGATIVE
Ketones, ur: NEGATIVE mg/dL
Leukocytes,Ua: NEGATIVE
Nitrite: NEGATIVE
Protein, ur: NEGATIVE mg/dL
Specific Gravity, Urine: 1.006 (ref 1.005–1.030)
pH: 7 (ref 5.0–8.0)

## 2022-03-23 LAB — SURGICAL PCR SCREEN
MRSA, PCR: NEGATIVE
Staphylococcus aureus: POSITIVE — AB

## 2022-03-23 LAB — TYPE AND SCREEN
ABO/RH(D): O POS
Antibody Screen: NEGATIVE

## 2022-03-23 LAB — C-REACTIVE PROTEIN: CRP: 0.6 mg/dL (ref <1.0)

## 2022-03-23 LAB — SEDIMENTATION RATE: Sed Rate: 3 mm/hr (ref 0–20)

## 2022-03-23 NOTE — Pre-Procedure Instructions (Signed)
Pt was taking Amlodipine but just stopped taking it due to it making his legs swell. PT states he will let his pcp know that he has stopped this. Pt is still taking Losartan every night for bp

## 2022-03-23 NOTE — Patient Instructions (Addendum)
Your procedure is scheduled on:04-02-22 Monday Report to the Registration Desk on the 1st floor of the [facility] Mall.Then proceed to the 2nd floor Surgery Desk To find out your arrival time, please call [phone] between 1PM - 3PM on:03-30-22 Friday If your arrival time is 6:00 am, do not arrive prior to that time as the [facility] Mall entrance doors do not open until 6:00 am.  REMEMBER: Instructions that are not followed completely may result in serious medical risk, up to and including death; or upon the discretion of your surgeon and anesthesiologist your surgery may need to be rescheduled.  Do not eat food after midnight the night before surgery.  No gum chewing, lozengers or hard candies.  You may however, drink CLEAR liquids up to 2 hours before you are scheduled to arrive for your surgery. Do not drink anything within 2 hours of your scheduled arrival time.  Clear liquids include: - water  - apple juice without pulp - gatorade (not RED colors) - black coffee or tea (Do NOT add milk or creamers to the coffee or tea) Do NOT drink anything that is not on this list.  In addition, your doctor has ordered for you to drink the provided  Ensure Pre-Surgery Clear Carbohydrate Drink  Drinking this carbohydrate drink up to two hours before surgery helps to reduce insulin resistance and improve patient outcomes. Please complete drinking 2 hours prior to scheduled arrival time.  Do NOT take any medication the day of surgery  One week prior to surgery: Stop Anti-inflammatories (NSAIDS) such as meloxicam (MOBIC), Advil, Aleve, Ibuprofen, Motrin, Naproxen, Naprosyn and Aspirin based products such as Excedrin, Goodys Powder, BC Powder.You may however, continue to take Tylenol if needed for pain up until the day of surgery.  Stop ANY OVER THE COUNTER supplements/vitamins 7 days prior to surgery  No Alcohol for 24 hours before or after surgery.  No Smoking including e-cigarettes for 24 hours  prior to surgery.  No chewable tobacco products for at least 6 hours prior to surgery.  No nicotine patches on the day of surgery.  Do not use any "recreational" drugs for at least a week prior to your surgery.  Please be advised that the combination of cocaine and anesthesia may have negative outcomes, up to and including death. If you test positive for cocaine, your surgery will be cancelled.  On the morning of surgery brush your teeth with toothpaste and water, you may rinse your mouth with mouthwash if you wish. Do not swallow any toothpaste or mouthwash.  Use CHG Soap as directed on instruction sheet.  Do not wear jewelry, make-up, hairpins, clips or nail polish.  Do not wear lotions, powders, or perfumes.   Do not shave body from the neck down 48 hours prior to surgery just in case you cut yourself which could leave a site for infection.  Also, freshly shaved skin may become irritated if using the CHG soap.  Contact lenses, hearing aids and dentures may not be worn into surgery.  Do not bring valuables to the hospital. [facility] is not responsible for any missing/lost belongings or valuables.  Notify your doctor if there is any change in your medical condition (cold, fever, infection).  Wear comfortable clothing (specific to your surgery type) to the hospital.  After surgery, you can help prevent lung complications by doing breathing exercises.  Take deep breaths and cough every 1-2 hours. Your doctor may order a device called an Incentive Spirometer to help you take  deep breaths. When coughing or sneezing, hold a pillow firmly against your incision with both hands. This is called "splinting." Doing this helps protect your incision. It also decreases belly discomfort.  If you are being admitted to the hospital overnight, leave your suitcase in the car. After surgery it may be brought to your room.  If you are being discharged the day of surgery, you will not be allowed to  drive home. You will need a responsible adult (18 years or older) to drive you home and stay with you that night.   If you are taking public transportation, you will need to have a responsible adult (18 years or older) with you. Please confirm with your physician that it is acceptable to use public transportation.   Please call the Pre-admissions Testing Dept. at [phone] if you have any questions about these instructions.  Surgery Visitation Policy:  Patients undergoing a surgery or procedure may have two family members or support persons with them as long as the person is not COVID-19 positive or experiencing its symptoms.   Inpatient Visitation:    Visiting hours are 7 a.m. to 8 p.m. Up to four visitors are allowed at one time in a patient room, including children. The visitors may rotate out with other people during the day. One designated support person (adult) may remain overnight.    How to Use an Incentive Spirometer An incentive spirometer is a tool that measures how well you are filling your lungs with each breath. Learning to take long, deep breaths using this tool can help you keep your lungs clear and active. This may help to reverse or lessen your chance of developing breathing (pulmonary) problems, especially infection. You may be asked to use a spirometer: After a surgery. If you have a lung problem or a history of smoking. After a long period of time when you have been unable to move or be active. If the spirometer includes an indicator to show the highest number that you have reached, your health care provider or respiratory therapist will help you set a goal. Keep a log of your progress as told by your health care provider. What are the risks? Breathing too quickly may cause dizziness or cause you to pass out. Take your time so you do not get dizzy or light-headed. If you are in pain, you may need to take pain medicine before doing incentive spirometry. It is harder  to take a deep breath if you are having pain. How to use your incentive spirometer  Sit up on the edge of your bed or on a chair. Hold the incentive spirometer so that it is in an upright position. Before you use the spirometer, breathe out normally. Place the mouthpiece in your mouth. Make sure your lips are closed tightly around it. Breathe in slowly and as deeply as you can through your mouth, causing the piston or the ball to rise toward the top of the chamber. Hold your breath for 3-5 seconds, or for as long as possible. If the spirometer includes a coach indicator, use this to guide you in breathing. Slow down your breathing if the indicator goes above the marked areas. Remove the mouthpiece from your mouth and breathe out normally. The piston or ball will return to the bottom of the chamber. Rest for a few seconds, then repeat the steps 10 or more times. Take your time and take a few normal breaths between deep breaths so that you do not get  dizzy or light-headed. Do this every 1-2 hours when you are awake. If the spirometer includes a goal marker to show the highest number you have reached (best effort), use this as a goal to work toward during each repetition. After each set of 10 deep breaths, cough a few times. This will help to make sure that your lungs are clear. If you have an incision on your chest or abdomen from surgery, place a pillow or a rolled-up towel firmly against the incision when you cough. This can help to reduce pain while taking deep breaths and coughing. General tips When you are able to get out of bed: Walk around often. Continue to take deep breaths and cough in order to clear your lungs. Keep using the incentive spirometer until your health care provider says it is okay to stop using it. If you have been in the hospital, you may be told to keep using the spirometer at home. Contact a health care provider if: You are having difficulty using the spirometer. You  have trouble using the spirometer as often as instructed. Your pain medicine is not giving enough relief for you to use the spirometer as told. You have a fever. Get help right away if: You develop shortness of breath. You develop a cough with bloody mucus from the lungs. You have fluid or blood coming from an incision site after you cough. Summary An incentive spirometer is a tool that can help you learn to take long, deep breaths to keep your lungs clear and active. You may be asked to use a spirometer after a surgery, if you have a lung problem or a history of smoking, or if you have been inactive for a long period of time. Use your incentive spirometer as instructed every 1-2 hours while you are awake. If you have an incision on your chest or abdomen, place a pillow or a rolled-up towel firmly against your incision when you cough. This will help to reduce pain. Get help right away if you have shortness of breath, you cough up bloody mucus, or blood comes from your incision when you cough. This information is not intended to replace advice given to you by your health care provider. Make sure you discuss any questions you have with your health care provider. Document Revised: 08/10/2019 Document Reviewed: 08/10/2019 Elsevier Patient Education  [place].

## 2022-03-29 NOTE — H&P (Signed)
ORTHOPAEDIC HISTORY & PHYSICAL Michelene Gardener, [place] - 03/22/2022 8:15 AM EDT Formatting of this note is different from the original. [facility] Chief Complaint:   Chief Complaint  Patient presents with  Knee Pain  H & P RIGHT KNEE   History of Present Illness:   Willie Patton is a 56 y.o. male that presents to clinic today for his preoperative history and evaluation. Patient presents unaccompanied. The patient is scheduled to undergo a right total knee arthroplasty on 04/02/22 by Dr. Ernest Pine. His pain began several years ago. The pain is located primarily along the medial aspect of the knee. He describes his pain as worse with weightbearing. He reports associated swelling with some giving way of the knee. He denies associated numbness or tingling, denies locking of the knee.   The patient's symptoms have progressed to the point that they decrease his quality of life. The patient has previously undergone conservative treatment including NSAIDS and to be modification without adequate control of his symptoms.  Patient denies significant cardiac history, history of DVT, or lumbar surgery. Patient will have his wife at home post-op to help out.   Reports penicillin allergy but IgE within normal limits.  Past Medical, Surgical, Family, Social History, Allergies, Medications:   Past Medical History:  Past Medical History:  Diagnosis Date  ADHD (attention deficit hyperactivity disorder)  Allergic state 2003  Arthritis 2012  Atypical chest pain 04/08/2017  Chronic back pain  Elevated PSA 04/25/2016  Essential hypertension 05/17/2017  Herpes labialis, uses prn Valtrex 05/09/2012  PONV (postoperative nausea and vomiting)   Past Surgical History:  Past Surgical History:  Procedure Laterality Date  Left total knee arthroplasty using computer-assisted navigation 06/09/2021  Dr Ernest Pine  ceervical spine x2 2000 and ?2003  Disc diseas  HERNIA  REPAIR  umbilical  KNEE ARTHROSCOPY  VASECTOMY   Current Medications:  Current Outpatient Medications  Medication Sig Dispense Refill  amLODIPine (NORVASC) 5 MG tablet Take 1 tablet (5 mg total) by mouth once daily for 360 days 90 tablet 3  cetirizine (ZYRTEC) 10 MG tablet Take 10 mg by mouth once daily  fluticasone (FLONASE) 50 mcg/actuation nasal spray Place 2 sprays into both nostrils once daily.  gabapentin (NEURONTIN) 300 MG capsule Take 1 capsule (300 mg total) by mouth at bedtime for 360 days 90 capsule 3  losartan (COZAAR) 100 MG tablet Take 1 tablet (100 mg total) by mouth once daily for 360 days 90 tablet 3  meloxicam (MOBIC) 15 MG tablet Take 1 tablet (15 mg total) by mouth once daily for 90 days 30 tablet 2  omeprazole (PRILOSEC) 20 MG DR capsule Take 1 capsule (20 mg total) by mouth once daily as needed  sildenafiL (VIAGRA) 100 MG tablet Take 0.5-1 tablets (50-100 mg total) by mouth once daily as needed for Erectile Dysfunction for up to 360 days 10 tablet 11  [START ON 04/02/2022] enoxaparin (LOVENOX) 40 mg/0.4 mL injection syringe Inject 0.4 mLs (40 mg total) subcutaneously once daily for 14 days 14 each 0   No current facility-administered medications for this visit.   Allergies:  Allergies  Allergen Reactions  Penicillins Hives  IgE w/in normal limits, 47  05/26/21   Social History:  Social History   Socioeconomic History  Marital status: Married  Spouse name: Tanya  Number of children: 2  Years of education: 16  Highest education level: Bachelor's degree (e.g., BA, AB, BS)  Occupational History  Occupation: Full time-Realtor  Tobacco Use  Smoking status: Never  Smokeless tobacco: Never  Vaping Use  Vaping Use: Never used  Substance and Sexual Activity  Alcohol use: Yes  Alcohol/week: 6.0 standard drinks of alcohol  Types: 6 Cans of beer per week  Comment: Weekends  Drug use: No  Sexual activity: Yes  Partners: Female  Birth control/protection:  Surgical  Comment: wife   Family History:  Family History  Problem Relation Age of Onset  Lung disease Mother  Pulmonary fibrosis?  Osteoarthritis Mother  Coronary Artery Disease (Blocked arteries around heart) Mother  Pacemaker Mother 25  Pancreatic cancer Father 21  Diabetes type II Father  No Known Problems Sister  No Known Problems Sister  No Known Problems Sister  No Known Problems Sister  Skin cancer Brother  High blood pressure (Hypertension) Brother  COPD Brother  Diabetes type II Brother  High blood pressure (Hypertension) Brother  Skin cancer Brother  No Known Problems Brother  No Known Problems Brother  Crohn's disease Son  Anxiety Son  W[place]  Anesthesia problems Neg Hx  Malignant hypertension Neg Hx  Colon cancer Neg Hx   Review of Systems:   A 10+ ROS was performed, reviewed, and the pertinent orthopaedic findings are documented in the HPI.   Physical Examination:   BP (!) 140/80 (BP Location: Left upper arm, Patient Position: Sitting, BP Cuff Size: Large Adult)  Ht 180.3 cm (5\' 11" )  Wt 99.5 kg (219 lb 6.4 oz)  BMI 30.60 kg/m   Patient is a well-developed, well-nourished male in no acute distress. Patient has normal mood and affect. Patient is alert and oriented to person, place, and time.   HEENT: Atraumatic, normocephalic. Pupils equal and reactive to light. Extraocular motion intact. Noninjected sclera.  Cardiovascular: Regular rate and rhythm, with no murmurs, rubs, or gallops. Distal pulses palpable. No carotid bruits  Respiratory: Lungs clear to auscultation bilaterally.   Right Knee: Soft tissue swelling: mild Effusion: minimal Erythema: none Crepitance: mild Tenderness: medial Alignment: relative varus Mediolateral laxity: medial pseudolaxity Posterior sag: negative Patellar tracking: Good tracking without evidence of subluxation or tilt Atrophy: No significant atrophy.  Quadriceps tone was good. Range of motion: 0/3/136  degrees  I would have plantarflex and dorsiflex the right ankle. Able to flex and extend the toes  Sensation intact over the saphenous, lateral sural cutaneous, superficial fibular, and deep fibular nerve distributions.  Tests Performed/Reviewed:  X-rays  No new x-rays were obtained. Review of previous films show severe loss of medial compartment joint space with associated osteophyte formation and subchondral sclerosis.  Impression:   ICD-10-CM  1. Primary osteoarthritis of right knee M17.11   Plan:   The patient has end-stage degenerative changes of the right knee. It was explained to the patient that the condition is progressive in nature. Having failed conservative treatment, the patient has elected to proceed with a total joint arthroplasty. The patient will undergo a total joint arthroplasty with Dr. 05-03-1989. The risks of surgery, including blood clot and infection, were discussed with the patient. Measures to reduce these risks, including the use of anticoagulation, perioperative antibiotics, and early ambulation were discussed. The importance of postoperative physical therapy was discussed with the patient. The patient elects to proceed with surgery. The patient is instructed to stop all blood thinners prior to surgery. The patient is instructed to call the hospital the day before surgery to learn of the proper arrival time.   Contact our office with any questions or concerns. Follow up as indicated, or  sooner should any new problems arise, if conditions worsen, or if they are otherwise concerned.   Gwenlyn Fudge, [facility] [place], [place] [address] Phone: [phone]  This note was generated in part with voice recognition software and I apologize for any typographical errors that were not detected and corrected.  Electronically signed by Gwenlyn Fudge, [place] at 03/23/2022 5:20 PM EDT

## 2022-04-01 ENCOUNTER — Encounter: Payer: Self-pay | Admitting: Orthopedic Surgery

## 2022-04-02 ENCOUNTER — Other Ambulatory Visit: Payer: Self-pay

## 2022-04-02 ENCOUNTER — Ambulatory Visit: Payer: BC Managed Care – PPO | Admitting: Anesthesiology

## 2022-04-02 ENCOUNTER — Encounter
Admission: RE | Disposition: A | Discharge status: Home / Self Care | Payer: Self-pay | Source: Ambulatory Visit | Attending: Orthopedic Surgery

## 2022-04-02 ENCOUNTER — Ambulatory Visit: Payer: BC Managed Care – PPO | Admitting: Urgent Care

## 2022-04-02 ENCOUNTER — Observation Stay
Admission: RE | Admit: 2022-04-02 | Discharge: 2022-04-03 | Disposition: A | Discharge status: Home / Self Care | Payer: BC Managed Care – PPO | Source: Ambulatory Visit | Attending: Orthopedic Surgery | Admitting: Orthopedic Surgery

## 2022-04-02 ENCOUNTER — Observation Stay: Payer: BC Managed Care – PPO

## 2022-04-02 DIAGNOSIS — M1711 Unilateral primary osteoarthritis, right knee: Secondary | ICD-10-CM | POA: Diagnosis present

## 2022-04-02 DIAGNOSIS — Z96652 Presence of left artificial knee joint: Secondary | ICD-10-CM | POA: Insufficient documentation

## 2022-04-02 DIAGNOSIS — Z79899 Other long term (current) drug therapy: Secondary | ICD-10-CM | POA: Insufficient documentation

## 2022-04-02 DIAGNOSIS — I10 Essential (primary) hypertension: Secondary | ICD-10-CM | POA: Insufficient documentation

## 2022-04-02 DIAGNOSIS — Z96659 Presence of unspecified artificial knee joint: Secondary | ICD-10-CM

## 2022-04-02 HISTORY — PX: KNEE ARTHROPLASTY: SHX992

## 2022-04-02 SURGERY — ARTHROPLASTY, KNEE, TOTAL, USING IMAGELESS COMPUTER-ASSISTED NAVIGATION
Anesthesia: Spinal | Site: Knee | Laterality: Right

## 2022-04-02 MED ORDER — TRANEXAMIC ACID-NACL 1000-0.7 MG/100ML-% IV SOLN
INTRAVENOUS | Status: AC
  Filled 2022-04-02: qty 100

## 2022-04-02 MED ORDER — PHENYLEPHRINE HCL-NACL 20-0.9 MG/250ML-% IV SOLN
INTRAVENOUS | Status: AC
  Filled 2022-04-02: qty 250

## 2022-04-02 MED ORDER — PHENOL 1.4 % MT LIQD: 1.0000 | OROMUCOSAL | Status: DC | PRN

## 2022-04-02 MED ORDER — METOCLOPRAMIDE HCL 10 MG PO TABS
10.0000 mg | ORAL_TABLET | Freq: Three times a day (TID) | ORAL | Status: DC
  Administered 2022-04-02 – 2022-04-03 (×3): 10 mg via ORAL

## 2022-04-02 MED ORDER — ORAL CARE MOUTH RINSE: 15.0000 mL | Freq: Once | OROMUCOSAL | Status: AC

## 2022-04-02 MED ORDER — SURGIPHOR WOUND IRRIGATION SYSTEM - OPTIME
TOPICAL | Status: DC | PRN
  Administered 2022-04-02: 1 via TOPICAL

## 2022-04-02 MED ORDER — TRAMADOL HCL 50 MG PO TABS: 50.0000 mg | ORAL_TABLET | ORAL | Status: DC | PRN

## 2022-04-02 MED ORDER — BISACODYL 10 MG RE SUPP: 10.0000 mg | Freq: Every day | RECTAL | Status: DC | PRN

## 2022-04-02 MED ORDER — FLEET ENEMA 7-19 GM/118ML RE ENEM: 1.0000 | ENEMA | Freq: Once | RECTAL | Status: DC | PRN

## 2022-04-02 MED ORDER — FENTANYL CITRATE (PF) 100 MCG/2ML IJ SOLN
INTRAMUSCULAR | Status: AC
  Filled 2022-04-02: qty 2

## 2022-04-02 MED ORDER — MAGNESIUM HYDROXIDE 400 MG/5ML PO SUSP
ORAL | Status: AC
  Administered 2022-04-02: 30 mL via ORAL
  Filled 2022-04-02: qty 30

## 2022-04-02 MED ORDER — GLYCOPYRROLATE 0.2 MG/ML IJ SOLN
INTRAMUSCULAR | Status: DC | PRN
  Administered 2022-04-02: .2 mg via INTRAVENOUS

## 2022-04-02 MED ORDER — SENNOSIDES-DOCUSATE SODIUM 8.6-50 MG PO TABS
1.0000 | ORAL_TABLET | Freq: Two times a day (BID) | ORAL | Status: DC
  Administered 2022-04-02: 1 via ORAL

## 2022-04-02 MED ORDER — ACETAMINOPHEN 10 MG/ML IV SOLN
INTRAVENOUS | Status: DC | PRN
  Administered 2022-04-02: 1000 mg via INTRAVENOUS

## 2022-04-02 MED ORDER — ALUM & MAG HYDROXIDE-SIMETH 200-200-20 MG/5ML PO SUSP: 30.0000 mL | ORAL | Status: DC | PRN

## 2022-04-02 MED ORDER — CEFAZOLIN SODIUM-DEXTROSE 2-4 GM/100ML-% IV SOLN
INTRAVENOUS | Status: AC
  Filled 2022-04-02: qty 100

## 2022-04-02 MED ORDER — LACTATED RINGERS IV SOLN: INTRAVENOUS | Status: DC

## 2022-04-02 MED ORDER — FERROUS SULFATE 325 (65 FE) MG PO TABS
325.0000 mg | ORAL_TABLET | Freq: Two times a day (BID) | ORAL | Status: DC
  Administered 2022-04-02 – 2022-04-03 (×2): 325 mg via ORAL
  Filled 2022-04-02: qty 1

## 2022-04-02 MED ORDER — CELECOXIB 200 MG PO CAPS
400.0000 mg | ORAL_CAPSULE | Freq: Once | ORAL | Status: AC
  Administered 2022-04-02: 400 mg via ORAL

## 2022-04-02 MED ORDER — OXYCODONE HCL 5 MG/5ML PO SOLN: 5.0000 mg | Freq: Once | ORAL | Status: DC | PRN

## 2022-04-02 MED ORDER — GABAPENTIN 300 MG PO CAPS
300.0000 mg | ORAL_CAPSULE | Freq: Once | ORAL | Status: AC
  Administered 2022-04-02: 300 mg via ORAL

## 2022-04-02 MED ORDER — TRANEXAMIC ACID-NACL 1000-0.7 MG/100ML-% IV SOLN
INTRAVENOUS | Status: AC
  Administered 2022-04-02: 1000 mg via INTRAVENOUS
  Filled 2022-04-02: qty 100

## 2022-04-02 MED ORDER — ACETAMINOPHEN 325 MG PO TABS: 325.0000 mg | ORAL_TABLET | Freq: Four times a day (QID) | ORAL | Status: DC | PRN

## 2022-04-02 MED ORDER — FAMOTIDINE 20 MG PO TABS
20.0000 mg | ORAL_TABLET | Freq: Once | ORAL | Status: AC
  Administered 2022-04-02: 20 mg via ORAL

## 2022-04-02 MED ORDER — CELECOXIB 200 MG PO CAPS
ORAL_CAPSULE | ORAL | Status: AC
  Filled 2022-04-02: qty 1

## 2022-04-02 MED ORDER — PROPOFOL 10 MG/ML IV BOLUS
INTRAVENOUS | Status: DC | PRN
  Administered 2022-04-02: 30 mg via INTRAVENOUS
  Administered 2022-04-02: 20 mg via INTRAVENOUS

## 2022-04-02 MED ORDER — LOSARTAN POTASSIUM 50 MG PO TABS
ORAL_TABLET | ORAL | Status: AC
  Filled 2022-04-02: qty 2

## 2022-04-02 MED ORDER — CELECOXIB 200 MG PO CAPS
ORAL_CAPSULE | ORAL | Status: AC
  Filled 2022-04-02: qty 2

## 2022-04-02 MED ORDER — GABAPENTIN 300 MG PO CAPS
ORAL_CAPSULE | ORAL | Status: AC
  Filled 2022-04-02: qty 1

## 2022-04-02 MED ORDER — CHLORHEXIDINE GLUCONATE 0.12 % MT SOLN: 15.0000 mL | Freq: Once | OROMUCOSAL | Status: AC

## 2022-04-02 MED ORDER — ONDANSETRON HCL 4 MG/2ML IJ SOLN: 4.0000 mg | Freq: Four times a day (QID) | INTRAMUSCULAR | Status: DC | PRN

## 2022-04-02 MED ORDER — SODIUM CHLORIDE 0.9 % IV SOLN: INTRAVENOUS | Status: DC

## 2022-04-02 MED ORDER — ACETAMINOPHEN 10 MG/ML IV SOLN
INTRAVENOUS | Status: AC
  Filled 2022-04-02: qty 100

## 2022-04-02 MED ORDER — CHLORHEXIDINE GLUCONATE 0.12 % MT SOLN
OROMUCOSAL | Status: AC
  Administered 2022-04-02: 15 mL via OROMUCOSAL
  Filled 2022-04-02: qty 15

## 2022-04-02 MED ORDER — ONDANSETRON HCL 4 MG PO TABS: 4.0000 mg | ORAL_TABLET | Freq: Four times a day (QID) | ORAL | Status: DC | PRN

## 2022-04-02 MED ORDER — BUPIVACAINE HCL (PF) 0.25 % IJ SOLN
INTRAMUSCULAR | Status: DC | PRN
  Administered 2022-04-02: 60 mL

## 2022-04-02 MED ORDER — BUPIVACAINE HCL (PF) 0.5 % IJ SOLN
INTRAMUSCULAR | Status: DC | PRN
  Administered 2022-04-02: 3 mL via INTRATHECAL

## 2022-04-02 MED ORDER — FAMOTIDINE 20 MG PO TABS
ORAL_TABLET | ORAL | Status: AC
  Filled 2022-04-02: qty 1

## 2022-04-02 MED ORDER — ENSURE PRE-SURGERY PO LIQD
296.0000 mL | Freq: Once | ORAL | Status: AC
  Administered 2022-04-02: 296 mL via ORAL

## 2022-04-02 MED ORDER — ACETAMINOPHEN 10 MG/ML IV SOLN: 1000.0000 mg | Freq: Four times a day (QID) | INTRAVENOUS | Status: DC

## 2022-04-02 MED ORDER — SENNOSIDES-DOCUSATE SODIUM 8.6-50 MG PO TABS
ORAL_TABLET | ORAL | Status: AC
  Filled 2022-04-02: qty 1

## 2022-04-02 MED ORDER — ADULT MULTIVITAMIN W/MINERALS CH
1.0000 | ORAL_TABLET | Freq: Every day | ORAL | Status: DC
  Administered 2022-04-03: 1 via ORAL

## 2022-04-02 MED ORDER — DEXAMETHASONE SODIUM PHOSPHATE 10 MG/ML IJ SOLN: 8.0000 mg | Freq: Once | INTRAMUSCULAR | Status: AC

## 2022-04-02 MED ORDER — MIDAZOLAM HCL 2 MG/2ML IJ SOLN
INTRAMUSCULAR | Status: AC
  Filled 2022-04-02: qty 2

## 2022-04-02 MED ORDER — AMLODIPINE BESYLATE 5 MG PO TABS
5.0000 mg | ORAL_TABLET | Freq: Every day | ORAL | Status: DC
  Administered 2022-04-03: 5 mg via ORAL

## 2022-04-02 MED ORDER — OXYCODONE HCL 5 MG PO TABS: 5.0000 mg | ORAL_TABLET | ORAL | Status: DC | PRN

## 2022-04-02 MED ORDER — CELECOXIB 200 MG PO CAPS
200.0000 mg | ORAL_CAPSULE | Freq: Two times a day (BID) | ORAL | Status: DC
  Administered 2022-04-02 – 2022-04-03 (×2): 200 mg via ORAL

## 2022-04-02 MED ORDER — OXYCODONE HCL 5 MG PO TABS: 5.0000 mg | ORAL_TABLET | Freq: Once | ORAL | Status: DC | PRN

## 2022-04-02 MED ORDER — ACETAMINOPHEN 10 MG/ML IV SOLN
INTRAVENOUS | Status: AC
  Administered 2022-04-02: 1000 mg via INTRAVENOUS
  Filled 2022-04-02: qty 100

## 2022-04-02 MED ORDER — LOSARTAN POTASSIUM 50 MG PO TABS
100.0000 mg | ORAL_TABLET | Freq: Every day | ORAL | Status: DC
  Administered 2022-04-02 – 2022-04-03 (×2): 100 mg via ORAL

## 2022-04-02 MED ORDER — 0.9 % SODIUM CHLORIDE (POUR BTL) OPTIME
TOPICAL | Status: DC | PRN
  Administered 2022-04-02: 500 mL

## 2022-04-02 MED ORDER — LIDOCAINE HCL URETHRAL/MUCOSAL 2 % EX GEL
CUTANEOUS | Status: AC
  Filled 2022-04-02: qty 6

## 2022-04-02 MED ORDER — TRANEXAMIC ACID-NACL 1000-0.7 MG/100ML-% IV SOLN: 1000.0000 mg | Freq: Once | INTRAVENOUS | Status: AC

## 2022-04-02 MED ORDER — GLYCOPYRROLATE 0.2 MG/ML IJ SOLN
INTRAMUSCULAR | Status: AC
  Filled 2022-04-02: qty 1

## 2022-04-02 MED ORDER — BUPIVACAINE HCL (PF) 0.5 % IJ SOLN
INTRAMUSCULAR | Status: AC
  Filled 2022-04-02: qty 10

## 2022-04-02 MED ORDER — MENTHOL 3 MG MT LOZG: 1.0000 | LOZENGE | OROMUCOSAL | Status: DC | PRN

## 2022-04-02 MED ORDER — PROPOFOL 1000 MG/100ML IV EMUL
INTRAVENOUS | Status: AC
  Filled 2022-04-02: qty 100

## 2022-04-02 MED ORDER — GABAPENTIN 300 MG PO CAPS
300.0000 mg | ORAL_CAPSULE | Freq: Every day | ORAL | Status: DC
  Administered 2022-04-02: 300 mg via ORAL

## 2022-04-02 MED ORDER — LORATADINE 10 MG PO TABS
10.0000 mg | ORAL_TABLET | Freq: Every day | ORAL | Status: DC
  Administered 2022-04-02 – 2022-04-03 (×2): 10 mg via ORAL
  Filled 2022-04-02: qty 1

## 2022-04-02 MED ORDER — PROPOFOL 500 MG/50ML IV EMUL
INTRAVENOUS | Status: DC | PRN
  Administered 2022-04-02: 50 ug/kg/min via INTRAVENOUS

## 2022-04-02 MED ORDER — CHLORHEXIDINE GLUCONATE 4 % EX LIQD
60.0000 mL | Freq: Once | CUTANEOUS | Status: DC
  Administered 2022-04-02: 4 via TOPICAL

## 2022-04-02 MED ORDER — MAGNESIUM HYDROXIDE 400 MG/5ML PO SUSP: 30.0000 mL | Freq: Every day | ORAL | Status: DC

## 2022-04-02 MED ORDER — PANTOPRAZOLE SODIUM 40 MG PO TBEC
40.0000 mg | DELAYED_RELEASE_TABLET | Freq: Two times a day (BID) | ORAL | Status: DC
  Administered 2022-04-02 – 2022-04-03 (×2): 40 mg via ORAL

## 2022-04-02 MED ORDER — METOCLOPRAMIDE HCL 10 MG PO TABS
ORAL_TABLET | ORAL | Status: AC
  Filled 2022-04-02: qty 1

## 2022-04-02 MED ORDER — FLUTICASONE PROPIONATE 50 MCG/ACT NA SUSP
2.0000 | Freq: Every day | NASAL | Status: DC
  Administered 2022-04-02 – 2022-04-03 (×2): 2 via NASAL
  Filled 2022-04-02: qty 16

## 2022-04-02 MED ORDER — SODIUM CHLORIDE 0.9 % IR SOLN
Status: DC | PRN
  Administered 2022-04-02: 3000 mL

## 2022-04-02 MED ORDER — CEFAZOLIN SODIUM-DEXTROSE 2-4 GM/100ML-% IV SOLN
2.0000 g | INTRAVENOUS | Status: AC
  Administered 2022-04-02: 2 g via INTRAVENOUS

## 2022-04-02 MED ORDER — DIPHENHYDRAMINE HCL 12.5 MG/5ML PO ELIX: 12.5000 mg | ORAL_SOLUTION | ORAL | Status: DC | PRN

## 2022-04-02 MED ORDER — MIDAZOLAM HCL 5 MG/5ML IJ SOLN
INTRAMUSCULAR | Status: DC | PRN
  Administered 2022-04-02 (×2): 2 mg via INTRAVENOUS

## 2022-04-02 MED ORDER — FENTANYL CITRATE (PF) 100 MCG/2ML IJ SOLN
INTRAMUSCULAR | Status: DC | PRN
  Administered 2022-04-02 (×2): 50 ug via INTRAVENOUS

## 2022-04-02 MED ORDER — FENTANYL CITRATE (PF) 100 MCG/2ML IJ SOLN: 25.0000 ug | INTRAMUSCULAR | Status: DC | PRN

## 2022-04-02 MED ORDER — PHENYLEPHRINE HCL-NACL 20-0.9 MG/250ML-% IV SOLN
INTRAVENOUS | Status: DC | PRN
  Administered 2022-04-02: 20 ug/min via INTRAVENOUS

## 2022-04-02 MED ORDER — SODIUM CHLORIDE 0.9 % IV SOLN
INTRAVENOUS | Status: DC | PRN
  Administered 2022-04-02: 60 mL

## 2022-04-02 MED ORDER — NEOMYCIN-POLYMYXIN B GU 40-200000 IR SOLN
Status: DC | PRN
  Administered 2022-04-02: 2 mL

## 2022-04-02 MED ORDER — PANTOPRAZOLE SODIUM 40 MG PO TBEC
DELAYED_RELEASE_TABLET | ORAL | Status: AC
  Filled 2022-04-02: qty 1

## 2022-04-02 MED ORDER — DEXAMETHASONE SODIUM PHOSPHATE 10 MG/ML IJ SOLN
INTRAMUSCULAR | Status: AC
  Administered 2022-04-02: 8 mg via INTRAVENOUS
  Filled 2022-04-02: qty 1

## 2022-04-02 MED ORDER — CEFAZOLIN SODIUM-DEXTROSE 2-4 GM/100ML-% IV SOLN
2.0000 g | Freq: Four times a day (QID) | INTRAVENOUS | Status: AC
  Administered 2022-04-02: 2 g via INTRAVENOUS
  Filled 2022-04-02 (×2): qty 100

## 2022-04-02 MED ORDER — TRANEXAMIC ACID-NACL 1000-0.7 MG/100ML-% IV SOLN
1000.0000 mg | INTRAVENOUS | Status: AC
  Administered 2022-04-02: 1000 mg via INTRAVENOUS

## 2022-04-02 MED ORDER — ENOXAPARIN SODIUM 30 MG/0.3ML IJ SOSY
30.0000 mg | PREFILLED_SYRINGE | Freq: Two times a day (BID) | INTRAMUSCULAR | Status: DC
  Administered 2022-04-03: 30 mg via SUBCUTANEOUS

## 2022-04-02 MED ORDER — DEXMEDETOMIDINE HCL IN NACL 200 MCG/50ML IV SOLN
INTRAVENOUS | Status: DC | PRN
  Administered 2022-04-02: 8 ug via INTRAVENOUS
  Administered 2022-04-02: 12 ug via INTRAVENOUS

## 2022-04-02 MED ORDER — HYDROMORPHONE HCL 1 MG/ML IJ SOLN: 0.5000 mg | INTRAMUSCULAR | Status: DC | PRN

## 2022-04-02 MED ORDER — OXYCODONE HCL 5 MG PO TABS: 10.0000 mg | ORAL_TABLET | ORAL | Status: DC | PRN

## 2022-04-02 SURGICAL SUPPLY — 72 items
ATTUNE MED DOME PAT 41 KNEE (Knees) IMPLANT
ATTUNE PS FEM RT SZ 8 CEM KNEE (Femur) IMPLANT
ATTUNE PSRP INSR SZ8 5 KNEE (Insert) IMPLANT
BASE TIBIAL ROT PLAT SZ 8 KNEE (Knees) IMPLANT
BATTERY INSTRU NAVIGATION (MISCELLANEOUS) ×4 IMPLANT
BLADE SAW 70X12.5 (BLADE) ×1 IMPLANT
BLADE SAW 90X13X1.19 OSCILLAT (BLADE) ×1 IMPLANT
BLADE SAW 90X25X1.19 OSCILLAT (BLADE) ×1 IMPLANT
CEMENT HV SMART SET (Cement) IMPLANT
COOLER POLAR GLACIER W/PUMP (MISCELLANEOUS) ×1 IMPLANT
CUFF TOURN SGL QUICK 24 (TOURNIQUET CUFF)
CUFF TOURN SGL QUICK 34 (TOURNIQUET CUFF)
CUFF TRNQT CYL 24X4X16.5-23 (TOURNIQUET CUFF) IMPLANT
CUFF TRNQT CYL 34X4.125X (TOURNIQUET CUFF) IMPLANT
DRAPE 3/4 80X56 (DRAPES) ×1 IMPLANT
DRAPE INCISE IOBAN 66X45 STRL (DRAPES) IMPLANT
DRSG DERMACEA 8X12 NADH (GAUZE/BANDAGES/DRESSINGS) IMPLANT
DRSG DERMACEA NONADH 3X8 (GAUZE/BANDAGES/DRESSINGS) ×1 IMPLANT
DRSG MEPILEX SACRM 8.7X9.8 (GAUZE/BANDAGES/DRESSINGS) ×1 IMPLANT
DRSG OPSITE POSTOP 4X14 (GAUZE/BANDAGES/DRESSINGS) ×1 IMPLANT
DRSG TEGADERM 4X4.75 (GAUZE/BANDAGES/DRESSINGS) ×1 IMPLANT
DURAPREP 26ML APPLICATOR (WOUND CARE) ×2 IMPLANT
ELECT CAUTERY BLADE 6.4 (BLADE) ×1 IMPLANT
ELECT REM PT RETURN 9FT ADLT (ELECTROSURGICAL) ×1
ELECTRODE REM PT RTRN 9FT ADLT (ELECTROSURGICAL) ×1 IMPLANT
EX-PIN ORTHOLOCK NAV 4X150 (PIN) ×2 IMPLANT
GLOVE BIOGEL M STRL SZ7.5 (GLOVE) ×2 IMPLANT
GLOVE BIOGEL PI IND STRL 7.5 (GLOVE) ×1 IMPLANT
GLOVE PI ORTHO PRO STRL 7.5 (GLOVE) ×2 IMPLANT
GLOVE SURG UNDER POLY LF SZ7.5 (GLOVE) ×1 IMPLANT
GOWN STRL REUS W/ TWL LRG LVL3 (GOWN DISPOSABLE) ×2 IMPLANT
GOWN STRL REUS W/ TWL XL LVL3 (GOWN DISPOSABLE) ×1 IMPLANT
GOWN STRL REUS W/TWL LRG LVL3 (GOWN DISPOSABLE) ×2
GOWN STRL REUS W/TWL XL LVL3 (GOWN DISPOSABLE) ×1
HEMOVAC 400CC 10FR (MISCELLANEOUS) ×1 IMPLANT
HOLDER FOLEY CATH W/STRAP (MISCELLANEOUS) ×1 IMPLANT
HOOD PEEL AWAY T7 (MISCELLANEOUS) ×2 IMPLANT
IV NS IRRIG 3000ML ARTHROMATIC (IV SOLUTION) ×1 IMPLANT
KIT TURNOVER KIT A (KITS) ×1 IMPLANT
KNIFE SCULPS 14X20 (INSTRUMENTS) ×1 IMPLANT
MANIFOLD NEPTUNE II (INSTRUMENTS) ×2 IMPLANT
NDL SPNL 20GX3.5 QUINCKE YW (NEEDLE) ×2 IMPLANT
NEEDLE SPNL 20GX3.5 QUINCKE YW (NEEDLE) ×2 IMPLANT
NS IRRIG 500ML POUR BTL (IV SOLUTION) ×1 IMPLANT
PACK TOTAL KNEE (MISCELLANEOUS) ×1 IMPLANT
PAD ABD DERMACEA PRESS 5X9 (GAUZE/BANDAGES/DRESSINGS) ×2 IMPLANT
PAD WRAPON POLAR KNEE (MISCELLANEOUS) ×1 IMPLANT
PIN DRILL FIX HALF THREAD (BIT) ×2 IMPLANT
PIN DRILL QUICK PACK ×2 IMPLANT
PIN FIXATION 1/8DIA X 3INL (PIN) ×1 IMPLANT
PULSAVAC PLUS IRRIG FAN TIP (DISPOSABLE) ×1
SOL PREP PVP 2OZ (MISCELLANEOUS) ×1
SOLUTION IRRIG SURGIPHOR (IV SOLUTION) ×1 IMPLANT
SOLUTION PREP PVP 2OZ (MISCELLANEOUS) ×1 IMPLANT
SPONGE DRAIN TRACH 4X4 STRL 2S (GAUZE/BANDAGES/DRESSINGS) ×1 IMPLANT
STAPLER SKIN PROX 35W (STAPLE) ×1 IMPLANT
STOCKINETTE IMPERV 14X48 (MISCELLANEOUS) IMPLANT
STRAP TIBIA SHORT (MISCELLANEOUS) ×1 IMPLANT
SUCTION FRAZIER HANDLE 10FR (MISCELLANEOUS) ×1
SUCTION TUBE FRAZIER 10FR DISP (MISCELLANEOUS) ×1 IMPLANT
SUT VIC AB 0 CT1 36 (SUTURE) ×2 IMPLANT
SUT VIC AB 1 CT1 36 (SUTURE) ×2 IMPLANT
SUT VIC AB 2-0 CT2 27 (SUTURE) ×1 IMPLANT
SYR 30ML LL (SYRINGE) ×2 IMPLANT
TIBIAL BASE ROT PLAT SZ 8 KNEE (Knees) ×1 IMPLANT
TIP FAN IRRIG PULSAVAC PLUS (DISPOSABLE) ×1 IMPLANT
TOWEL OR 17X26 4PK STRL BLUE (TOWEL DISPOSABLE) IMPLANT
TOWER CARTRIDGE SMART MIX (DISPOSABLE) ×1 IMPLANT
TRAP FLUID SMOKE EVACUATOR (MISCELLANEOUS) ×1 IMPLANT
TRAY FOLEY MTR SLVR 16FR STAT (SET/KITS/TRAYS/PACK) ×1 IMPLANT
WATER STERILE IRR 1000ML POUR (IV SOLUTION) ×1 IMPLANT
WRAPON POLAR PAD KNEE (MISCELLANEOUS) ×1

## 2022-04-02 NOTE — Discharge Summary (Signed)
Physician Discharge Summary  Patient ID: Willie Patton MRN: [id number] DOB/AGE: 06-12-1965 56 y.o.  Admit date: 04/02/2022 Discharge date: 04/03/2022  Admission Diagnoses:  Total knee replacement status [Z96.659]  Surgeries:Procedure(s):  Right total knee arthroplasty using computer-assisted navigation   SURGEON:  Jena Gauss. M.D.   ASSISTANT: Baldwin Jamaica, PA-C (present and scrubbed throughout the case, critical for assistance with exposure, retraction, instrumentation, and closure)   ANESTHESIA: spinal   ESTIMATED BLOOD LOSS: 50 mL   FLUIDS REPLACED: 1500 mL of crystalloid   TOURNIQUET TIME: 90 minutes   DRAINS: 2 medium Hemovac drains   SOFT TISSUE RELEASES: Anterior cruciate ligament, posterior cruciate ligament, deep medial collateral ligament, patellofemoral ligament   IMPLANTS UTILIZED: DePuy Attune size 8 posterior stabilized femoral component (cemented), size 8 rotating platform tibial component (cemented), 41 mm medialized dome patella (cemented), and a 5 mm stabilized rotating platform polyethylene insert.  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/2021   DDD (degenerative disc disease), lumbar 11/30/2020   Erectile dysfunction 05/08/2020   Essential hypertension 05/17/2017   Atypical chest pain 04/08/2017   Chronic midline low back pain with right-sided sciatica 02/11/2017   Elevated PSA 04/25/2016   Inguinal hernia without obstruction or gangrene 07/29/2014   Back pain 05/09/2012   Herpes labialis 05/09/2012    Past Medical History:  Diagnosis Date   ADHD (attention deficit hyperactivity disorder)    Anginal pain (HCC) 04/08/2017   atypical chest pain   Arthritis    Back pain, chronic    Complication of anesthesia     hard to wake up after neck surgery   DDD (degenerative disc disease), lumbar    Elevated PSA    GERD (gastroesophageal reflux disease)    Herpes labialis    Hypertension    PONV (postoperative nausea and vomiting)    Pre-diabetes      Transfusion:    Consultants (if any):   Discharged Condition: Improved  Hospital Course: Willie Patton is an 56 y.o. male who was admitted 04/02/2022 with a diagnosis of right knee osteoarthritis and went to the operating room on 04/02/2022 and underwent right total knee arthroplasty. The patient received perioperative antibiotics for prophylaxis (see below). The patient tolerated the procedure well and was transported to PACU in stable condition. After meeting PACU criteria, the patient was subsequently transferred to the Orthopaedics/Rehabilitation unit.   The patient received DVT prophylaxis in the form of early mobilization, Lovenox, Foot Pumps, and SCDs . A sacral pad had been placed and heels were elevated off of the bed with rolled towels in order to protect skin integrity. Foley catheter was discontinued on postoperative day #0. Wound drains were discontinued on postoperative day #1. The surgical incision was healing well without signs of infection.  Physical therapy was initiated postoperatively for transfers, gait training, and strengthening. Occupational therapy was initiated for activities of daily living and evaluation for assisted devices. Rehabilitation goals were reviewed in detail with the patient. The patient made steady progress with physical therapy and physical therapy recommended discharge to Home.   The patient achieved the preliminary goals of this hospitalization and was felt to be medically and orthopaedically appropriate for discharge.  He was given perioperative antibiotics:  Anti-infectives (From admission, onward)    Start  Dose/Rate Route Frequency Ordered Stop   04/02/22 1802  ceFAZolin (ANCEF) 2-4 GM/100ML-% IVPB        Note to Pharmacy: Dalbert Batman L: cabinet override      04/02/22 1802 04/02/22 1800   04/02/22 1800  ceFAZolin (ANCEF) IVPB 2g/100 mL premix        2 g 200 mL/hr over 30 Minutes Intravenous Every 6 hours 04/02/22 1611 04/03/22 0115   04/02/22 1031  ceFAZolin (ANCEF) 2-4 GM/100ML-% IVPB       Note to Pharmacy: Maryagnes Amos B: cabinet override      04/02/22 1031 04/02/22 1120   04/02/22 1000  ceFAZolin (ANCEF) IVPB 2g/100 mL premix        2 g 200 mL/hr over 30 Minutes Intravenous On call to O.R. 04/02/22 1610 04/02/22 1148     .  Recent vital signs:  Vitals:   04/03/22 0537 04/03/22 0750  BP: (!) 140/83 (!) 143/85  Pulse: 80 71  Resp: 16 16  Temp: 98.5 F (36.9 C) 98.5 F (36.9 C)  SpO2: 97% 96%    Recent laboratory studies:  No results for input(s): "WBC", "HGB", "HCT", "PLT", "K", "CL", "CO2", "BUN", "CREATININE", "GLUCOSE", "CALCIUM", "LABPT", "INR" in the last 72 hours.  Diagnostic Studies: DG Knee Right Port  Result Date: 04/02/2022 CLINICAL DATA:  Status post right knee arthroplasty. EXAM: PORTABLE RIGHT KNEE - 1-2 VIEW COMPARISON:  None Available. FINDINGS: Status post recent total right knee arthroplasty. No perihardware lucency is seen to indicate hardware failure or loosening. There are anterior surgical skin staples. Expected postoperative anterior soft tissue swelling and subcutaneous air. Superior approach superomedial knee two drainage catheters. No acute fracture or dislocation. IMPRESSION: Status post recent total right knee arthroplasty without evidence of hardware failure. Electronically Signed   By: Yvonne Kendall M.D.   On: 04/02/2022 15:19    Discharge Medications:   Allergies as of 04/03/2022       Reactions   Penicillins Hives   IgE = 47 (WNL) on 05/26/2021        Medication List     STOP taking these medications    meloxicam 15 MG tablet Commonly known as: MOBIC       TAKE these medications    amLODipine 5 MG tablet Commonly  known as: NORVASC Take 5 mg by mouth daily.   celecoxib 200 MG capsule Commonly known as: CELEBREX Take 1 capsule (200 mg total) by mouth 2 (two) times daily.   cetirizine 10 MG tablet Commonly known as: ZYRTEC Take 10 mg by mouth at bedtime.   fluticasone 50 MCG/ACT nasal spray Commonly known as: FLONASE Place 2 sprays into both nostrils daily.   gabapentin 300 MG capsule Commonly known as: NEURONTIN Take 300 mg by mouth at bedtime.   losartan 100 MG tablet Commonly known as: COZAAR Take 100 mg by mouth at bedtime.   multivitamin with minerals Tabs tablet Take 1 tablet by mouth daily.   oxyCODONE 5 MG immediate release tablet Commonly known as: Oxy IR/ROXICODONE Take 1 tablet (5 mg total) by mouth every 4 (four) hours as needed for severe pain.   traMADol 50 MG tablet Commonly known as: ULTRAM Take 1 tablet (50 mg total) by mouth every 4 (four) hours as needed for moderate pain.               Durable Medical Equipment  (From admission, onward)           Start     Ordered  04/02/22 1612  DME Walker rolling  Once       Question:  Patient needs a walker to treat with the following condition  Answer:  Total knee replacement status   04/02/22 1611   04/02/22 1612  DME Bedside commode  Once       Question:  Patient needs a bedside commode to treat with the following condition  Answer:  Total knee replacement status   04/02/22 1611            Disposition: Home with home health PT     Follow-up Information     Madelyn Flavors, PA-C Follow up on 04/17/2022.   Specialty: Orthopedic Surgery Why: at 8:45am Contact information: [address] [facility] [place] [address] [phone]         Donato Heinz, MD Follow up on 05/15/2022.   Specialty: Orthopedic Surgery Why: at 2:30pm Contact information: [address] [facility] [address] [facility] [place] [address] [phone]                   Lasandra Beech, PA-C 04/03/2022, 11:19 AM

## 2022-04-02 NOTE — Op Note (Signed)
OPERATIVE NOTE  DATE OF SURGERY:  04/02/2022  PATIENT NAME:  Willie Patton   DOB: 05/03/1966  MRN: [id number]  PRE-OPERATIVE DIAGNOSIS: Degenerative arthrosis of the right knee, primary  POST-OPERATIVE DIAGNOSIS:  Same  PROCEDURE:  Right total knee arthroplasty using computer-assisted navigation  SURGEON:  Jena Gauss. M.D.  ASSISTANT: Baldwin Jamaica, PA-C (present and scrubbed throughout the case, critical for assistance with exposure, retraction, instrumentation, and closure)  ANESTHESIA: spinal  ESTIMATED BLOOD LOSS: 50 mL  FLUIDS REPLACED: 1500 mL of crystalloid  TOURNIQUET TIME: 90 minutes  DRAINS: 2 medium Hemovac drains  SOFT TISSUE RELEASES: Anterior cruciate ligament, posterior cruciate ligament, deep medial collateral ligament, patellofemoral ligament  IMPLANTS UTILIZED: DePuy Attune size 8 posterior stabilized femoral component (cemented), size 8 rotating platform tibial component (cemented), 41 mm medialized dome patella (cemented), and a 5 mm stabilized rotating platform polyethylene insert.  INDICATIONS FOR SURGERY: Willie Patton is a 56 y.o. year old male with a long history of progressive knee pain. X-rays demonstrated severe degenerative changes in tricompartmental fashion. The patient had not seen any significant improvement despite conservative nonsurgical intervention. After discussion of the risks and benefits of surgical intervention, the patient expressed understanding of the risks benefits and agree with plans for total knee arthroplasty.   The risks, benefits, and alternatives were discussed at length including but not limited to the risks of infection, bleeding, nerve injury, stiffness, blood clots, the need for revision surgery, cardiopulmonary complications, among others, and they were willing to proceed.  PROCEDURE IN DETAIL: The patient was brought into the operating room and, after adequate spinal anesthesia was achieved, a tourniquet was placed on the  patient's upper thigh. The patient's knee and leg were cleaned and prepped with alcohol and DuraPrep and draped in the usual sterile fashion. A "timeout" was performed as per usual protocol. The lower extremity was exsanguinated using an Esmarch, and the tourniquet was inflated to 300 mmHg. An anterior longitudinal incision was made followed by a standard mid vastus approach. The deep fibers of the medial collateral ligament were elevated in a subperiosteal fashion off of the medial flare of the tibia so as to maintain a continuous soft tissue sleeve. The patella was subluxed laterally and the patellofemoral ligament was incised. Inspection of the knee demonstrated severe degenerative changes with full-thickness loss of articular cartilage. Osteophytes were debrided using a rongeur. Anterior and posterior cruciate ligaments were excised. Two 4.0 mm Schanz pins were inserted in the femur and into the tibia for attachment of the array of trackers used for computer-assisted navigation. Hip center was identified using a circumduction technique. Distal landmarks were mapped using the computer. The distal femur and proximal tibia were mapped using the computer. The distal femoral cutting guide was positioned using computer-assisted navigation so as to achieve a 5 distal valgus cut. The femur was sized and it was felt that a size 8 femoral component was appropriate. A size 8 femoral cutting guide was positioned and the anterior cut was performed and verified using the computer. This was followed by completion of the posterior and chamfer cuts. Femoral cutting guide for the central box was then positioned in the center box cut was performed.  Attention was then directed to the proximal tibia. Medial and lateral menisci were excised. The extramedullary tibial cutting guide was positioned using computer-assisted navigation so as to achieve a 0 varus-valgus alignment and 3 posterior slope. The cut was performed and  verified using the computer. The proximal tibia was  sized and it was felt that a size 8 tibial tray was appropriate. Tibial and femoral trials were inserted followed by insertion of a 5 mm polyethylene insert. This allowed for excellent mediolateral soft tissue balancing both in flexion and in full extension. Finally, the patella was cut and prepared so as to accommodate a 41 mm medialized dome patella. A patella trial was placed and the knee was placed through a range of motion with excellent patellar tracking appreciated. The femoral trial was removed after debridement of posterior osteophytes. The central post-hole for the tibial component was reamed followed by insertion of a keel punch. Tibial trials were then removed. Cut surfaces of bone were irrigated with copious amounts of normal saline using pulsatile lavage and then suctioned dry. Polymethylmethacrylate cement was prepared in the usual fashion using a vacuum mixer. Cement was applied to the cut surface of the proximal tibia as well as along the undersurface of a size 8 rotating platform tibial component. Tibial component was positioned and impacted into place. Excess cement was removed using [employer] fast streamer. Cement was then applied to the cut surfaces of the femur as well as along the posterior flanges of the size 8 femoral component. The femoral component was positioned and impacted into place. Excess cement was removed using [employer] fast streamer. A 5 mm polyethylene trial was inserted and the knee was brought into full extension with steady axial compression applied. Finally, cement was applied to the backside of a 41 mm medialized dome patella and the patellar component was positioned and patellar clamp applied. Excess cement was removed using [employer] fast streamer. After adequate curing of the cement, the tourniquet was deflated after a total tourniquet time of 90 minutes. Hemostasis was achieved using electrocautery. The knee was irrigated with copious amounts  of normal saline using pulsatile lavage followed by 450 ml of Surgiphor and then suctioned dry. 20 mL of 1.3% Exparel and 60 mL of 0.25% Marcaine in 40 mL of normal saline was injected along the posterior capsule, medial and lateral gutters, and along the arthrotomy site. A 5 mm stabilized rotating platform polyethylene insert was inserted and the knee was placed through a range of motion with excellent mediolateral soft tissue balancing appreciated and excellent patellar tracking noted. 2 medium drains were placed in the wound bed and brought out through separate stab incisions. The medial parapatellar portion of the incision was reapproximated using interrupted sutures of #1 Vicryl. Subcutaneous tissue was approximated in layers using first #0 Vicryl followed #2-0 Vicryl. The skin was approximated with skin staples. A sterile dressing was applied.  The patient tolerated the procedure well and was transported to the recovery room in stable condition.    Mohab Ashby P. Holley Bouche., M.D.

## 2022-04-02 NOTE — Anesthesia Preprocedure Evaluation (Signed)
Anesthesia Evaluation  Patient identified by MRN, date of birth, ID band Patient awake    Reviewed: Allergy & Precautions, NPO status , Patient's Chart, lab work & pertinent test results  History of Anesthesia Complications (+) PONV and history of anesthetic complications  Airway Mallampati: III  TM Distance: <3 FB Neck ROM: full    Dental  (+) Chipped   Pulmonary neg pulmonary ROS,    Pulmonary exam normal        Cardiovascular Exercise Tolerance: Good hypertension, (-) anginaNormal cardiovascular exam     Neuro/Psych PSYCHIATRIC DISORDERS  Neuromuscular disease    GI/Hepatic Neg liver ROS, GERD  Controlled,  Endo/Other  negative endocrine ROS  Renal/GU      Musculoskeletal   Abdominal   Peds  Hematology negative hematology ROS (+)   Anesthesia Other Findings Past Medical History: No date: ADHD (attention deficit hyperactivity disorder) 04/08/2017: Anginal pain (HCC)     Comment:  atypical chest pain No date: Arthritis No date: Back pain, chronic No date: Complication of anesthesia     Comment:  hard to wake up after neck surgery No date: DDD (degenerative disc disease), lumbar No date: Elevated PSA No date: GERD (gastroesophageal reflux disease) No date: Herpes labialis No date: Hypertension No date: PONV (postoperative nausea and vomiting) No date: Pre-diabetes  Past Surgical History: No date: CERVICAL SPINE SURGERY No date: COLONOSCOPY No date: HERNIA REPAIR     Comment:  umbilical and ventral 12/10/2954: KNEE ARTHROPLASTY; Left     Comment:  Procedure: COMPUTER ASSISTED TOTAL KNEE ARTHROPLASTY -               RNFA;  Surgeon: Dereck Leep, MD;  Location: [facility] ORS;              Service: Orthopedics;  Laterality: Left; No date: KNEE ARTHROSCOPY No date: VASECTOMY  BMI    Body Mass Index: 30.13 kg/m      Reproductive/Obstetrics negative OB ROS                              Anesthesia Physical Anesthesia Plan  ASA: 3  Anesthesia Plan: Spinal   Post-op Pain Management:    Induction:   PONV Risk Score and Plan:   Airway Management Planned: Natural Airway and Nasal Cannula  Additional Equipment:   Intra-op Plan:   Post-operative Plan:   Informed Consent: I have reviewed the patients History and Physical, chart, labs and discussed the procedure including the risks, benefits and alternatives for the proposed anesthesia with the patient or authorized representative who has indicated his/her understanding and acceptance.     Dental Advisory Given  Plan Discussed with: Anesthesiologist, CRNA and Surgeon  Anesthesia Plan Comments: (Patient reports no bleeding problems and no anticoagulant use.  Plan for spinal with backup GA  Patient consented for risks of anesthesia including but not limited to:  - adverse reactions to medications - damage to eyes, teeth, lips or other oral mucosa - nerve damage due to positioning  - risk of bleeding, infection and or nerve damage from spinal that could lead to paralysis - risk of headache or failed spinal - damage to teeth, lips or other oral mucosa - sore throat or hoarseness - damage to heart, brain, nerves, lungs, other parts of body or loss of life  Patient voiced understanding.)        Anesthesia Quick Evaluation

## 2022-04-02 NOTE — Anesthesia Procedure Notes (Signed)
Spinal  Patient location during procedure: OR Reason for block: surgical anesthesia Staffing Performed: resident/CRNA  Resident/CRNA: Rolla Plate, CRNA Performed by: Rolla Plate, CRNA Authorized by: Andria Frames, MD   Preanesthetic Checklist Completed: patient identified, IV checked, site marked, risks and benefits discussed, surgical consent, monitors and equipment checked, pre-op evaluation and timeout performed Spinal Block Patient position: sitting Prep: ChloraPrep and site prepped and draped Patient monitoring: heart rate, continuous pulse ox, blood pressure and cardiac monitor Approach: midline Location: L4-5 Injection technique: single-shot Needle Needle type: Whitacre and Introducer  Needle gauge: 24 G Needle length: 9 cm Assessment Events: CSF return Additional Notes Negative paresthesia. Negative blood return. Positive free-flowing CSF. Expiration date of kit checked and confirmed. Patient tolerated procedure well, without complications.

## 2022-04-02 NOTE — Interval H&P Note (Signed)
History and Physical Interval Note:  04/02/2022 10:38 AM  Willie Patton  has presented today for surgery, with the diagnosis of Primary osteoarthritis of right knee M17.11.  The various methods of treatment have been discussed with the patient and family. After consideration of risks, benefits and other options for treatment, the patient has consented to  Procedure(s): COMPUTER ASSISTED TOTAL KNEE ARTHROPLASTY (Right) as a surgical intervention.  The patient's history has been reviewed, patient examined, no change in status, stable for surgery.  I have reviewed the patient's chart and labs.  Questions were answered to the patient's satisfaction.     [place]

## 2022-04-02 NOTE — Transfer of Care (Signed)
Immediate Anesthesia Transfer of Care Note  Patient: Willie Patton  Procedure(s) Performed: COMPUTER ASSISTED TOTAL KNEE ARTHROPLASTY (Right: Knee)  Patient Location: PACU  Anesthesia Type:Spinal  Level of Consciousness: awake  Airway & Oxygen Therapy: Patient Spontanous Breathing  Post-op Assessment: Report given to RN and Post -op Vital signs reviewed and stable  Post vital signs: Reviewed  Last Vitals:  Vitals Value Taken Time  BP    Temp    Pulse 79 04/02/22 1448  Resp 12 04/02/22 1448  SpO2 98 % 04/02/22 1448  Vitals shown include unvalidated device data.  Last Pain:  Vitals:   04/02/22 1020  TempSrc: Oral  PainSc: 0-No pain         Complications: No notable events documented.

## 2022-04-02 NOTE — Progress Notes (Signed)
PT Cancellation Note  Patient Details Name: Willie Patton MRN: [id number] DOB: [birth year]   Cancelled Treatment:    Reason Eval/Treat Not Completed: Patient not medically ready.  PT consult received.  Chart reviewed.  Per discussion with pt's PACU nurse, pt's LE's too weak to participate in therapy at this time (pt s/p spinal anesthesia).  Will re-attempt PT evaluation tomorrow.  Leitha Bleak, PT 04/02/22, 4:33 PM

## 2022-04-03 ENCOUNTER — Encounter: Payer: Self-pay | Admitting: Orthopedic Surgery

## 2022-04-03 DIAGNOSIS — M1711 Unilateral primary osteoarthritis, right knee: Secondary | ICD-10-CM | POA: Diagnosis not present

## 2022-04-03 MED ORDER — MAGNESIUM HYDROXIDE 400 MG/5ML PO SUSP
ORAL | Status: AC
  Filled 2022-04-03: qty 30

## 2022-04-03 MED ORDER — TRAMADOL HCL 50 MG PO TABS: 50.0000 mg | ORAL_TABLET | ORAL | 0 refills | Status: AC | PRN

## 2022-04-03 MED ORDER — ACETAMINOPHEN 10 MG/ML IV SOLN
INTRAVENOUS | Status: AC
  Filled 2022-04-03: qty 100

## 2022-04-03 MED ORDER — METOCLOPRAMIDE HCL 10 MG PO TABS
ORAL_TABLET | ORAL | Status: AC
  Filled 2022-04-03: qty 1

## 2022-04-03 MED ORDER — SENNA 8.6 MG PO TABS
ORAL_TABLET | ORAL | Status: AC
  Filled 2022-04-03: qty 1

## 2022-04-03 MED ORDER — CELECOXIB 200 MG PO CAPS: 200.0000 mg | ORAL_CAPSULE | Freq: Two times a day (BID) | ORAL | 0 refills | Status: AC

## 2022-04-03 MED ORDER — ACETAMINOPHEN 10 MG/ML IV SOLN
INTRAVENOUS | Status: AC
  Administered 2022-04-03: 1000 mg via INTRAVENOUS
  Filled 2022-04-03: qty 100

## 2022-04-03 MED ORDER — CELECOXIB 200 MG PO CAPS
ORAL_CAPSULE | ORAL | Status: AC
  Filled 2022-04-03: qty 1

## 2022-04-03 MED ORDER — ENOXAPARIN SODIUM 30 MG/0.3ML IJ SOSY
PREFILLED_SYRINGE | INTRAMUSCULAR | Status: AC
  Filled 2022-04-03: qty 0.3

## 2022-04-03 MED ORDER — CEFAZOLIN SODIUM-DEXTROSE 2-4 GM/100ML-% IV SOLN
INTRAVENOUS | Status: AC
  Administered 2022-04-03: 2 g via INTRAVENOUS
  Filled 2022-04-03: qty 100

## 2022-04-03 MED ORDER — OXYCODONE HCL 5 MG PO TABS: 5.0000 mg | ORAL_TABLET | ORAL | 0 refills | Status: AC | PRN

## 2022-04-03 MED ORDER — PANTOPRAZOLE SODIUM 40 MG PO TBEC
DELAYED_RELEASE_TABLET | ORAL | Status: AC
  Filled 2022-04-03: qty 1

## 2022-04-03 MED ORDER — AMLODIPINE BESYLATE 5 MG PO TABS
ORAL_TABLET | ORAL | Status: AC
  Filled 2022-04-03: qty 1

## 2022-04-03 MED ORDER — FERROUS SULFATE 325 (65 FE) MG PO TABS
ORAL_TABLET | ORAL | Status: AC
  Filled 2022-04-03: qty 1

## 2022-04-03 MED ORDER — ADULT MULTIVITAMIN W/MINERALS CH
ORAL_TABLET | ORAL | Status: AC
  Filled 2022-04-03: qty 1

## 2022-04-03 MED ORDER — LOSARTAN POTASSIUM 50 MG PO TABS
ORAL_TABLET | ORAL | Status: AC
  Filled 2022-04-03: qty 2

## 2022-04-03 MED ORDER — LORATADINE 10 MG PO TABS
ORAL_TABLET | ORAL | Status: AC
  Filled 2022-04-03: qty 1

## 2022-04-03 NOTE — Progress Notes (Signed)
OT Screen Note  Patient Details Name: Willie Patton MRN: [id number] DOB: [birth year]   Cancelled Treatment:    Reason Eval/Treat Not Completed: OT screened, no needs identified, will sign off. Consult received, chart reviewed. Pt and spouse verbalize confidence with recovery process from previous TKA. Pt/spouse able to verbalize plan for managing polar care, ADL, and mobility. Handout provided to support recall. No additional OT needs at this time. Will sign off.    Ardeth Perfect., MPH, MS, OTR/L ascom [phone] 04/03/22, 10:50 AM

## 2022-04-03 NOTE — Anesthesia Postprocedure Evaluation (Signed)
Anesthesia Post Note  Patient: Willie Patton  Procedure(s) Performed: COMPUTER ASSISTED TOTAL KNEE ARTHROPLASTY (Right: Knee)  Patient location during evaluation: Nursing Unit Anesthesia Type: Spinal Level of consciousness: oriented and awake and alert Pain management: pain level controlled Vital Signs Assessment: post-procedure vital signs reviewed and stable Respiratory status: spontaneous breathing and respiratory function stable Cardiovascular status: blood pressure returned to baseline and stable Postop Assessment: no headache, no backache, no apparent nausea or vomiting and patient able to bend at knees Anesthetic complications: no   No notable events documented.   Last Vitals:  Vitals:   04/03/22 0537 04/03/22 0750  BP: (!) 140/83 (!) 143/85  Pulse: 80 71  Resp: 16 16  Temp: 36.9 C 36.9 C  SpO2: 97% 96%    Last Pain:  Vitals:   04/03/22 0750  TempSrc: Oral  PainSc: [place]

## 2022-04-03 NOTE — TOC Progression Note (Signed)
Transition of Care [facility]) - Progression Note    Patient Details  Name: Willie Patton MRN: [id number] Date of Birth: 05/06/1966  Transition of Care [facility]) CM/SW [place], RN Phone Number: 04/03/2022, 10:29 AM  Clinical Narrative:     The patient is set up with [facility], He lives at home with his wife He has a RW at home and does not need additional DME       Expected Discharge Plan and Services                                                 Social Determinants of Health (SDOH) Interventions Housing Interventions: Intervention Not Indicated  Readmission Risk Interventions     No data to display

## 2022-04-03 NOTE — Plan of Care (Signed)
Nsg Discharge Note  Admit Date:  04/02/2022 Discharge date: 04/03/2022   Alfonzo Beers to be D/C'd Home per MD order.  AVS completed.  Patient/caregiver able to verbalize understanding.  Discharge Medication: Allergies as of 04/03/2022       Reactions   Penicillins Hives   IgE = 47 (WNL) on 05/26/2021        Medication List     STOP taking these medications    meloxicam 15 MG tablet Commonly known as: MOBIC       TAKE these medications    amLODipine 5 MG tablet Commonly known as: NORVASC Take 5 mg by mouth daily.   celecoxib 200 MG capsule Commonly known as: CELEBREX Take 1 capsule (200 mg total) by mouth 2 (two) times daily.   cetirizine 10 MG tablet Commonly known as: ZYRTEC Take 10 mg by mouth at bedtime.   fluticasone 50 MCG/ACT nasal spray Commonly known as: FLONASE Place 2 sprays into both nostrils daily.   gabapentin 300 MG capsule Commonly known as: NEURONTIN Take 300 mg by mouth at bedtime.   losartan 100 MG tablet Commonly known as: COZAAR Take 100 mg by mouth at bedtime.   multivitamin with minerals Tabs tablet Take 1 tablet by mouth daily.   oxyCODONE 5 MG immediate release tablet Commonly known as: Oxy IR/ROXICODONE Take 1 tablet (5 mg total) by mouth every 4 (four) hours as needed for severe pain.   traMADol 50 MG tablet Commonly known as: ULTRAM Take 1 tablet (50 mg total) by mouth every 4 (four) hours as needed for moderate pain.               Durable Medical Equipment  (From admission, onward)           Start     Ordered   04/02/22 1612  DME Walker rolling  Once       Question:  Patient needs a walker to treat with the following condition  Answer:  Total knee replacement status   04/02/22 1611   04/02/22 1612  DME Bedside commode  Once       Question:  Patient needs a bedside commode to treat with the following condition  Answer:  Total knee replacement status   04/02/22 1611            Discharge  Assessment: Vitals:   04/03/22 0750 04/03/22 1123  BP: (!) 143/85 (!) 148/87  Pulse: 71 71  Resp: 16 16  Temp: 98.5 F (36.9 C)   SpO2: 96% 97%   Skin clean, dry and intact without evidence of skin break down, no evidence of skin tears noted. IV catheter discontinued intact. Site without signs and symptoms of complications - no redness or edema noted at insertion site, patient denies c/o pain - only slight tenderness at site.  Dressing with slight pressure applied.  D/c Instructions-Education: Discharge instructions given to patient/family with verbalized understanding. D/c education completed with patient/family including follow up instructions, medication list, d/c activities limitations if indicated, with other d/c instructions as indicated by MD - patient able to verbalize understanding, all questions fully answered. Patient instructed to return to ED, call 911, or call MD for any changes in condition.  Patient escorted via [place], and D/C home via private auto.  Merilynn Finland, RN 04/03/2022 11:30 AM

## 2022-04-03 NOTE — Evaluation (Signed)
Physical Therapy Evaluation Patient Details Name: Willie Patton MRN: [id number] DOB: 04/09/1966 Today's Date: 04/03/2022  History of Present Illness  56 y/o male s/p R TKA 04/02/22, had L TKA earlier this year with good results.  Clinical Impression  Pt eager to work with PT and ultimately did very well.  He was able to engage quad well showing good strength, had AROM >120 deg of flexion, easily ambulated 200 ft and negotiated steps with minimal cuing.  Overall pt doing very nicely POD1, expect he should be able to safely return home when medically cleared for d/c.       Recommendations for follow up therapy are one component of a multi-disciplinary discharge planning process, led by the attending physician.  Recommendations may be updated based on patient status, additional functional criteria and insurance authorization.  Follow Up Recommendations Follow physician's recommendations for discharge plan and follow up therapies      Assistance Recommended at Discharge None  Patient can return home with the following  Assistance with cooking/housework    Equipment Recommendations Rolling walker (2 wheels)  Recommendations for Other Services       Functional Status Assessment Patient has had a recent decline in their functional status and demonstrates the ability to make significant improvements in function in a reasonable and predictable amount of time.     Precautions / Restrictions Precautions Precautions: Fall Restrictions Weight Bearing Restrictions: Yes RLE Weight Bearing: Weight bearing as tolerated      Mobility  Bed Mobility Overal bed mobility: Independent                  Transfers Overall transfer level: Independent Equipment used: Rolling walker (2 wheels)               General transfer comment: easily attains standing with out hesitant or need for assist    Ambulation/Gait Ambulation/Gait assistance: Min guard Gait Distance (Feet): 200  Feet Assistive device: Rolling walker (2 wheels)         General Gait Details: Pt able to maintain confidence cadence.  Encouraged to slow and be more focused on symmetical/deliberate steppage as he did have some very mild/easily self arrested buckling on the R while going "too fast"  Stairs Stairs: Yes Stairs assistance: Supervision Stair Management: One rail Right Number of Stairs: 8 General stair comments: Pt up/down steps multiple times with single rail, discussed appropriate strategy and considerations, pt able to manage w/o issue  Wheelchair Mobility    Modified Rankin (Stroke Patients Only)       Balance Overall balance assessment: Independent                                           Pertinent Vitals/Pain Pain Assessment Pain Assessment: 0-10 Pain Score: 6     Home Living Family/patient expects to be discharged to:: Private residence Living Arrangements: Spouse/significant other Available Help at Discharge: Family;Available 24 hours/day Type of Home: House Home Access: Stairs to enter Entrance Stairs-Rails: Right (post) Entrance Stairs-Number of Steps: 2   Home Layout: Able to live on main level with bedroom/bathroom Home Equipment: Conservation officer, nature (2 wheels);Rollator (4 wheels)      Prior Function Prior Level of Function : Independent/Modified Independent;Working/employed;Driving             Mobility Comments: Independent without AD, working as a Cabin crew ADLs Comments: independent  Hand Dominance        Extremity/Trunk Assessment   Upper Extremity Assessment Upper Extremity Assessment: Overall WFL for tasks assessed    Lower Extremity Assessment Lower Extremity Assessment: Overall WFL for tasks assessed (good quad set and >120 AROM on L)       Communication   Communication: No difficulties  Cognition Arousal/Alertness: Awake/alert Behavior During Therapy: WFL for tasks assessed/performed Overall Cognitive Status:  Within Functional Limits for tasks assessed                                          General Comments      Exercises Total Joint Exercises Ankle Circles/Pumps: AROM, 10 reps Quad Sets: Strengthening, 10 reps Heel Slides: AROM, 10 reps (with resisted leg ext) Hip ABduction/ADduction: AAROM, 10 reps Straight Leg Raises: Strengthening, 10 reps Knee Flexion: PROM, 5 reps Goniometric ROM: 0-122 AROM   Assessment/Plan    PT Assessment Patient needs continued PT services  PT Problem List Decreased strength;Decreased range of motion;Decreased activity tolerance;Decreased balance;Decreased mobility;Decreased knowledge of use of DME;Decreased safety awareness;Pain       PT Treatment Interventions DME instruction;Gait training;Stair training;Functional mobility training;Therapeutic activities;Therapeutic exercise;Balance training;Neuromuscular re-education;Patient/family education    PT Goals (Current goals can be found in the Care Plan section)  Acute Rehab PT Goals Patient Stated Goal: go home ASAP PT Goal Formulation: With patient Time For Goal Achievement: 04/16/22 Potential to Achieve Goals: Good    Frequency BID     Co-evaluation               AM-PAC PT "6 Clicks" Mobility  Outcome Measure Help needed turning from your back to your side while in a flat bed without using bedrails?: None Help needed moving from lying on your back to sitting on the side of a flat bed without using bedrails?: None Help needed moving to and from a bed to a chair (including a wheelchair)?: None Help needed standing up from a chair using your arms (e.g., wheelchair or bedside chair)?: None Help needed to walk in hospital room?: None Help needed climbing 3-5 steps with a railing? : None 6 Click Score: 24    End of Session Equipment Utilized During Treatment: Gait belt Activity Tolerance: Patient tolerated treatment well Patient left: with call bell/phone within reach;in  bed Nurse Communication: Mobility status PT Visit Diagnosis: Muscle weakness (generalized) (M62.81);Difficulty in walking, not elsewhere classified (R26.2);Pain Pain - Right/Left: Right Pain - part of body: Knee    Time: 8366-2947 PT Time Calculation (min) (ACUTE ONLY): 28 min   Charges:   PT Evaluation $PT Eval Low Complexity: 1 Low PT Treatments $Gait Training: 8-22 mins $Therapeutic Exercise: 8-22 mins        Malachi Pro, DPT 04/03/2022, 9:18 AM

## 2022-04-03 NOTE — Progress Notes (Signed)
  Subjective: 1 Day Post-Op Procedure(s) (LRB): COMPUTER ASSISTED TOTAL KNEE ARTHROPLASTY (Right) Patient reports pain as well-controlled.   Patient is well, and has had no acute complaints or problems Plan is to go Home after hospital stay. Negative for chest pain and shortness of breath Fever: no Gastrointestinal: negative for nausea and vomiting.   Patient has not had a bowel movement.  Objective: Vital signs in last 24 hours: Temp:  [97 F (36.1 C)-98.5 F (36.9 C)] 98.5 F (36.9 C) (10/31 0750) Pulse Rate:  [67-91] 71 (10/31 0750) Resp:  [15-20] 16 (10/31 0750) BP: (103-153)/(68-97) 143/85 (10/31 0750) SpO2:  [95 %-99 %] 96 % (10/31 0750)  Intake/Output from previous day:  Intake/Output Summary (Last 24 hours) at 04/03/2022 1115 Last data filed at 04/03/2022 0923 Gross per 24 hour  Intake 3040 ml  Output 3140 ml  Net -100 ml    Intake/Output this shift: Total I/O In: -  Out: 90 [Drains:90]  Labs: No results for input(s): "HGB" in the last 72 hours. No results for input(s): "WBC", "RBC", "HCT", "PLT" in the last 72 hours. No results for input(s): "NA", "K", "CL", "CO2", "BUN", "CREATININE", "GLUCOSE", "CALCIUM" in the last 72 hours. No results for input(s): "LABPT", "INR" in the last 72 hours.   EXAM General - Patient is Alert, Appropriate, and Oriented Extremity - Neurovascular intact Dorsiflexion/Plantar flexion intact Compartment soft Dressing/Incision -Postoperative dressing remains in place., Polar Care in place and working. , Hemovac in place. , Following removal of post-op dressing, no drainage noted Motor Function - intact, moving foot and toes well on exam.  Cardiovascular- Regular rate and rhythm, no murmurs/rubs/gallops Respiratory- Lungs clear to auscultation bilaterally Gastrointestinal- soft, nontender, and active bowel sounds   Assessment/Plan: 1 Day Post-Op Procedure(s) (LRB): COMPUTER ASSISTED TOTAL KNEE ARTHROPLASTY (Right) Principal  Problem:   Total knee replacement status  Estimated body mass index is 30.13 kg/m as calculated from the following:   Height as of this encounter: 5\' 10"  (1.778 m).   Weight as of this encounter: 95.3 kg. Advance diet Up with therapy    Post-op dressing removed. , Hemovac removed., and Mini compression dressing applied.   DVT Prophylaxis - Lovenox, Ted hose, and SCDs Weight-Bearing as tolerated to right leg  Cassell Smiles, PA-C [facility] Orthopaedic Surgery 04/03/2022, 11:15 AM

## 2023-11-21 IMAGING — DX DG KNEE 1-2V PORT*L*
2 series · 2 of 2 positions shown · non-contrast
Comparison: None.

CLINICAL DATA: Status post left knee replacement.

EXAM:
PORTABLE LEFT KNEE - 1-2 VIEW

[knee ap]
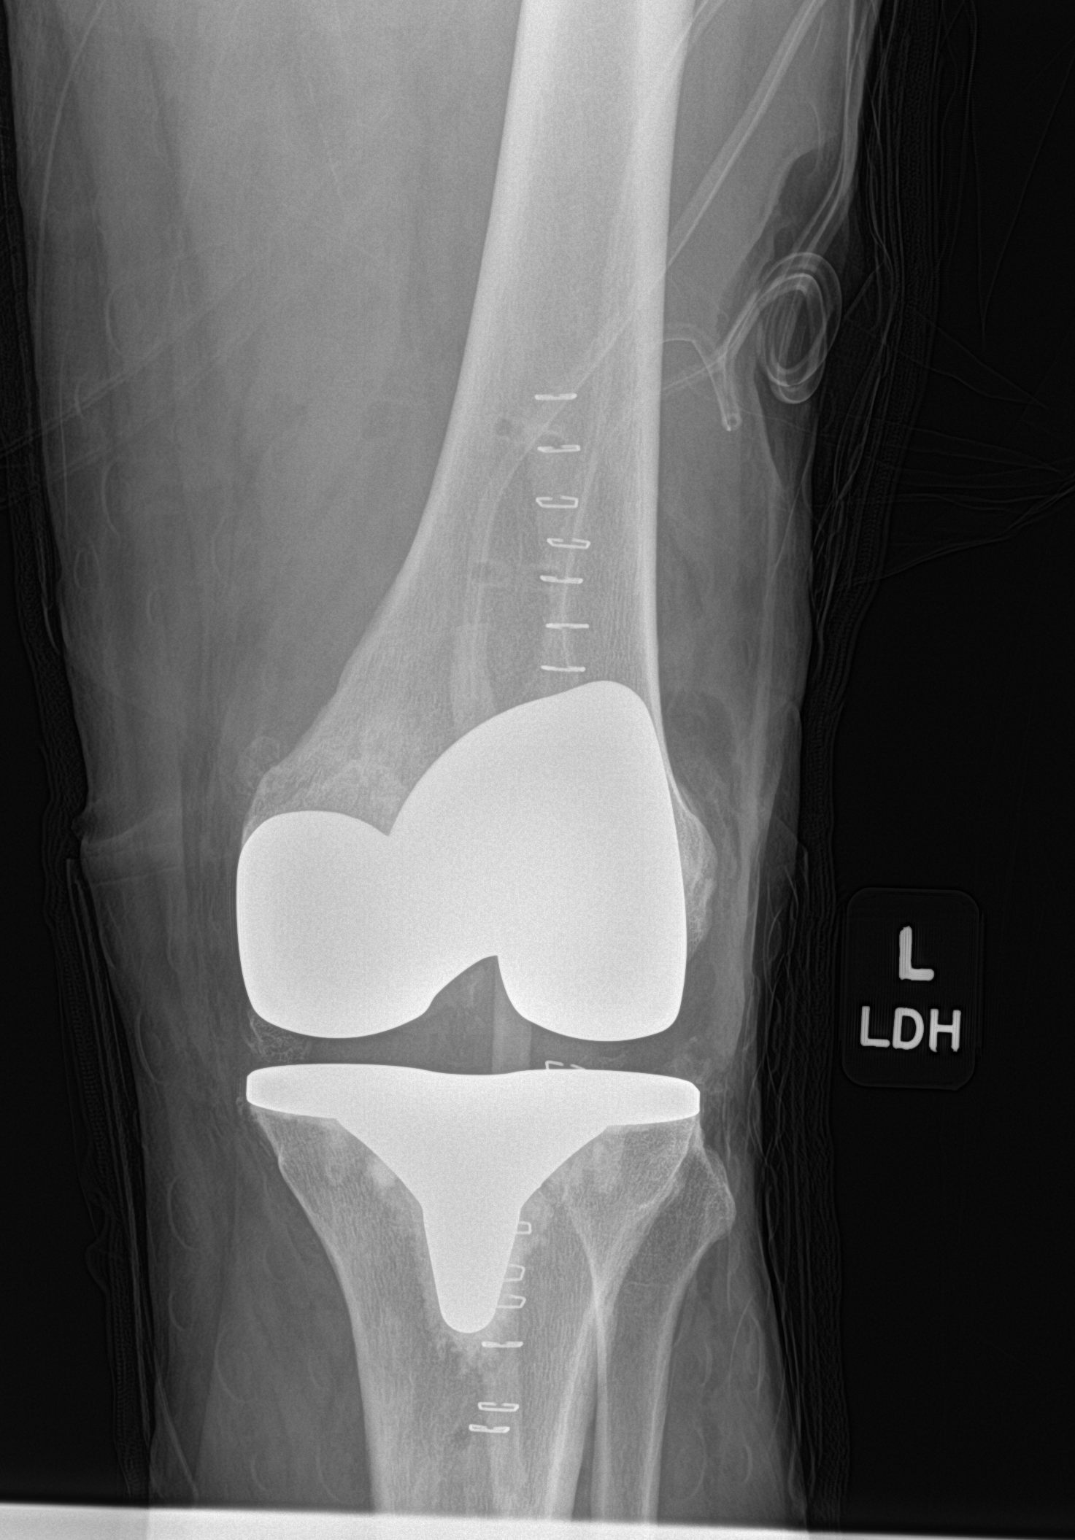

[knee lat]
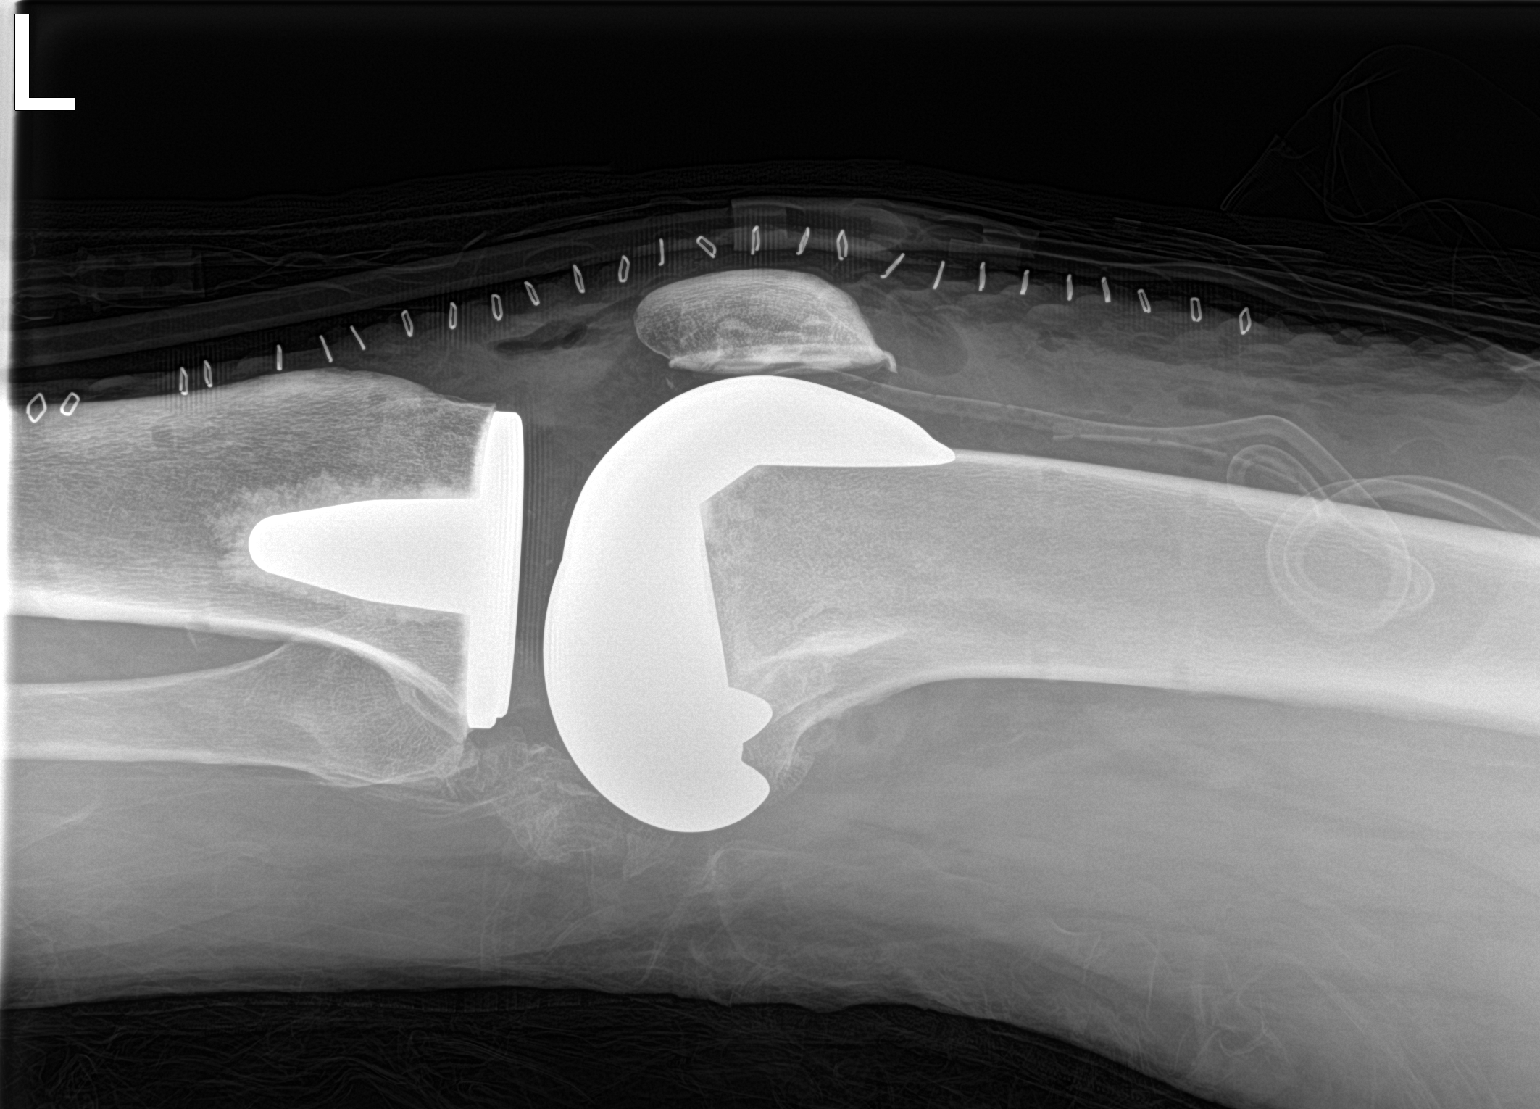

[2 of 2 positions shown; findings below may reference images not displayed]

FINDINGS: Left femoral and tibial components are well situated. Expected
postoperative changes and surgical drain are noted anteriorly.
IMPRESSION: Status post left total knee arthroplasty.
